# Patient Record
Sex: Male | Born: 1965 | Race: White | Hispanic: No
Health system: Southern US, Community
[De-identification: ages and names within clinical notes are randomized; demographics above are authoritative.]

## PROBLEM LIST (undated history)

## (undated) DIAGNOSIS — J9621 Acute and chronic respiratory failure with hypoxia: Secondary | ICD-10-CM

## (undated) DIAGNOSIS — G9341 Metabolic encephalopathy: Secondary | ICD-10-CM

## (undated) DIAGNOSIS — I482 Chronic atrial fibrillation, unspecified: Secondary | ICD-10-CM

## (undated) DIAGNOSIS — J449 Chronic obstructive pulmonary disease, unspecified: Secondary | ICD-10-CM

## (undated) DIAGNOSIS — J189 Pneumonia, unspecified organism: Secondary | ICD-10-CM

---

## 2020-10-15 ENCOUNTER — Other Ambulatory Visit (HOSPITAL_COMMUNITY): Payer: Medicare Other

## 2020-10-15 ENCOUNTER — Inpatient Hospital Stay
Admission: RE | Admit: 2020-10-15 | Discharge: 2020-11-11 | Disposition: A | Discharge status: Skilled Nursing Facility | Payer: Medicare Other | Attending: Internal Medicine | Admitting: Internal Medicine

## 2020-10-15 DIAGNOSIS — K567 Ileus, unspecified: Secondary | ICD-10-CM

## 2020-10-15 DIAGNOSIS — K819 Cholecystitis, unspecified: Secondary | ICD-10-CM

## 2020-10-15 DIAGNOSIS — D72829 Elevated white blood cell count, unspecified: Secondary | ICD-10-CM

## 2020-10-15 DIAGNOSIS — J449 Chronic obstructive pulmonary disease, unspecified: Secondary | ICD-10-CM | POA: Diagnosis present

## 2020-10-15 DIAGNOSIS — Z931 Gastrostomy status: Secondary | ICD-10-CM

## 2020-10-15 DIAGNOSIS — Z4659 Encounter for fitting and adjustment of other gastrointestinal appliance and device: Secondary | ICD-10-CM

## 2020-10-15 DIAGNOSIS — J189 Pneumonia, unspecified organism: Secondary | ICD-10-CM

## 2020-10-15 DIAGNOSIS — R140 Abdominal distension (gaseous): Secondary | ICD-10-CM

## 2020-10-15 DIAGNOSIS — R109 Unspecified abdominal pain: Secondary | ICD-10-CM

## 2020-10-15 DIAGNOSIS — I482 Chronic atrial fibrillation, unspecified: Secondary | ICD-10-CM | POA: Diagnosis present

## 2020-10-15 DIAGNOSIS — J9621 Acute and chronic respiratory failure with hypoxia: Secondary | ICD-10-CM | POA: Diagnosis present

## 2020-10-15 DIAGNOSIS — G9341 Metabolic encephalopathy: Secondary | ICD-10-CM | POA: Diagnosis present

## 2020-10-15 DIAGNOSIS — J690 Pneumonitis due to inhalation of food and vomit: Secondary | ICD-10-CM

## 2020-10-15 DIAGNOSIS — Z978 Presence of other specified devices: Secondary | ICD-10-CM

## 2020-10-15 DIAGNOSIS — E8770 Fluid overload, unspecified: Secondary | ICD-10-CM

## 2020-10-15 HISTORY — DX: Chronic atrial fibrillation, unspecified: I48.20

## 2020-10-15 HISTORY — DX: Metabolic encephalopathy: G93.41

## 2020-10-15 HISTORY — DX: Pneumonia, unspecified organism: J18.9

## 2020-10-15 HISTORY — DX: Acute and chronic respiratory failure with hypoxia: J96.21

## 2020-10-15 HISTORY — DX: Chronic obstructive pulmonary disease, unspecified: J44.9

## 2020-10-15 LAB — COMPREHENSIVE METABOLIC PANEL
ALT: 16 U/L (ref 0–44)
AST: 18 U/L (ref 15–41)
Albumin: 3.1 g/dL — ABNORMAL LOW (ref 3.5–5.0)
Alkaline Phosphatase: 93 U/L (ref 38–126)
Anion gap: 14 (ref 5–15)
BUN: 11 mg/dL (ref 6–20)
CO2: 27 mmol/L (ref 22–32)
Calcium: 9.5 mg/dL (ref 8.9–10.3)
Chloride: 97 mmol/L — ABNORMAL LOW (ref 98–111)
Creatinine, Ser: 0.59 mg/dL — ABNORMAL LOW (ref 0.61–1.24)
GFR, Estimated: >60 mL/min (ref >60)
Glucose, Bld: 79 mg/dL (ref 70–99)
Potassium: 3.9 mmol/L (ref 3.5–5.1)
Sodium: 138 mmol/L (ref 135–145)
Total Bilirubin: 2.4 mg/dL — ABNORMAL HIGH (ref 0.3–1.2)
Total Protein: 7.5 g/dL (ref 6.5–8.1)

## 2020-10-15 LAB — CBC
HCT: 38 % — ABNORMAL LOW (ref 39.0–52.0)
Hemoglobin: 11.2 g/dL — ABNORMAL LOW (ref 13.0–17.0)
MCH: 25.8 pg — ABNORMAL LOW (ref 26.0–34.0)
MCHC: 29.5 g/dL — ABNORMAL LOW (ref 30.0–36.0)
MCV: 87.6 fL (ref 80.0–100.0)
Platelets: 325 10*3/uL (ref 150–400)
RBC: 4.34 MIL/uL (ref 4.22–5.81)
RDW: 22.4 % — ABNORMAL HIGH (ref 11.5–15.5)
WBC: 12.7 10*3/uL — ABNORMAL HIGH (ref 4.0–10.5)
nRBC: 0 % (ref 0.0–0.2)

## 2020-10-15 MED ORDER — IOHEXOL 300 MG/ML  SOLN
50.0000 mL | Freq: Once | INTRAMUSCULAR | Status: AC | PRN
  Administered 2020-10-15: 50 mL

## 2020-10-16 DIAGNOSIS — J449 Chronic obstructive pulmonary disease, unspecified: Secondary | ICD-10-CM

## 2020-10-16 DIAGNOSIS — J9621 Acute and chronic respiratory failure with hypoxia: Secondary | ICD-10-CM

## 2020-10-16 DIAGNOSIS — J189 Pneumonia, unspecified organism: Secondary | ICD-10-CM | POA: Diagnosis not present

## 2020-10-16 DIAGNOSIS — I482 Chronic atrial fibrillation, unspecified: Secondary | ICD-10-CM

## 2020-10-16 DIAGNOSIS — G9341 Metabolic encephalopathy: Secondary | ICD-10-CM

## 2020-10-16 LAB — VANCOMYCIN, TROUGH: Vancomycin Tr: 12 ug/mL — ABNORMAL LOW (ref 15–20)

## 2020-10-16 NOTE — Consult Note (Signed)
Pulmonary Critical Care Medicine [facility] [place]  PULMONARY SERVICE  Date of Service: 10/16/2020  PULMONARY CRITICAL CARE CONSULT   Maurice Brennan  LTR:[id number]  DOB: 12-04-1965   DOA: 10/15/2020  Referring Physician: Carron Curie, MD  HPI: Maurice Brennan is a 55 y.o. male seen for follow up of Acute on Chronic Respiratory Failure.  Patient has multiple medical problems including chronic alcohol use anxiety BPH COPD depression GERD hyperlipidemia hypertension presents to the hospital basically for fluid overload.  Patient was found at that time to be in atrial fibrillation pulmonary edema.  The patient was started on Cardizem drip for rate controlled continue to have issues as far as pain is concerned so was treated as such.  Hospital course continued with atrial fibrillation which became rate controlled.  Cardiology followed the patient.  On January 4 patient had a drop in blood pressure heart rate was 82 saturations were good.  The patient had some decline in respiratory status with having to go back on BiPAP.  Ultimately ended up severely hypoxic and ended up intubated on the mechanical ventilator.  After patient had been intubated self extubated on the January 9 and was left off the ventilator on BiPAP however decompensated had to be placed back on the ventilator.  Subsequently underwent tracheostomy for anticipated prolonged mechanical ventilation transferred to our facility for further management.  Review of Systems:  ROS performed and is unremarkable other than noted above.  Past Medical History:  Diagnosis Date  . Alcohol abuse  . Anemia  . Anxiety  . BPH (benign prostatic hyperplasia)  . COPD (chronic obstructive pulmonary disease) (CMS-HCC)  . Depression  . GERD (gastroesophageal reflux disease)  . Hyperlipidemia  . Hypertension  . Lumbosacral disc disease  . Osteoarthritis  . Stroke (cerebrum) (CMS-HCC)   Past Surgical History  Past Surgical History:  Procedure  Laterality Date  . APPENDECTOMY  . BACK SURGERY Bilateral  . COLONOSCOPY  . ESOPHAGOGASTRODUODENOSCOPY   Family History  The patient's family history includes Cancer in his mother..  Social History:  Social History   Socioeconomic History  . Marital status: Divorced  Spouse name: Not on file  . Number of children: Not on file  . Years of education: Not on file  . Highest education level: Not on file  Occupational History  . Not on file  Tobacco Use  . Smoking status: Current Every Day Smoker  Packs/day: 1.00  Years: 40.00  Pack years: 40.00  . Smokeless tobacco: Never Used  Substance and Sexual Activity  . Alcohol use: Yes  Alcohol/week: 24.0 standard drinks  Types: 24 Cans of beer per week  Comment: States he quit the 16th of last month  . Drug use: Not Currently  . Sexual activity: Not on file  Other Topics Concern  . Not on file  Social History Narrative  . Not on file     Physical Exam:  Vitals: Temperature is 98.9 pulse 128 respiratory rate 21 blood pressure is 117/67 saturations 97%  Ventilator Settings off the ventilator on T collar FiO2 40%  . General: Comfortable at this time . Eyes: Grossly normal lids, irises & conjunctiva . ENT: grossly tongue is normal . Neck: no obvious mass . Cardiovascular: S1-S2 normal no gallop or rub . Respiratory: Scattered rhonchi noted . Abdomen: Soft and nontender . Skin: no rash seen on limited exam . Musculoskeletal: not rigid . Psychiatric:unable to assess . Neurologic: no seizure no involuntary movements  Labs on Admission:  Basic Metabolic Panel: Recent Labs  Lab 10/15/20 2219  NA 138  K 3.9  CL 97*  CO2 27  GLUCOSE 79  BUN 11  CREATININE 0.59*  CALCIUM 9.5    No results for input(s): PHART, PCO2ART, PO2ART, HCO3, O2SAT in the last 168 hours.  Liver Function Tests: Recent Labs  Lab 10/15/20 2219  AST 18  ALT 16  ALKPHOS 93  BILITOT 2.4*  PROT 7.5  ALBUMIN 3.1*   No results for  input(s): LIPASE, AMYLASE in the last 168 hours. No results for input(s): AMMONIA in the last 168 hours.  CBC: Recent Labs  Lab 10/15/20 2219  WBC 12.7*  HGB 11.2*  HCT 38.0*  MCV 87.6  PLT 325    Cardiac Enzymes: No results for input(s): CKTOTAL, CKMB, CKMBINDEX, TROPONINI in the last 168 hours.  BNP (last 3 results) No results for input(s): BNP in the last 8760 hours.  ProBNP (last 3 results) No results for input(s): PROBNP in the last 8760 hours.   Radiological Exams on Admission: DG ABDOMEN PEG TUBE LOCATION  Result Date: 10/15/2020 CLINICAL DATA:  Gastrostomy tube EXAM: ABDOMEN - 1 VIEW COMPARISON:  None. FINDINGS: The bowel gas pattern is normal. Gastrostomy rube projects over the body of the stomach. Contrast opacified the stomach and duodenal bulb. No gross extravasation. Round calcification in the right mid abdomen may represent gallstone or kidney stone. IMPRESSION: Gastrostomy tube appears to be within the stomach. No gross extravasation. Nonobstructive bowel gas pattern. Electronically Signed   By: Jasmine Pang M.D.   On: 10/15/2020 21:30   DG CHEST PORT 1 VIEW  Result Date: 10/15/2020 CLINICAL DATA:  Trach placement EXAM: PORTABLE CHEST 1 VIEW COMPARISON:  None. FINDINGS: Tracheostomy tube with tip 4.1 cm above carina. Right IJ CVL tip over low right atrium.Cardiomegaly. Airspace disease at the left base. IMPRESSION: 1. Tracheostomy tube with tip 4.1 cm above the carina. 2. Right UJ central venous catheter tip over low right atrium, no pneumothorax 3. Cardiomegaly. 4. Patchy airspace disease at the left base, atelectasis vs pneumonia Electronically Signed   By: Jasmine Pang M.D.   On: 10/15/2020 21:33    Assessment/Plan Active Problems:   Acute on chronic respiratory failure with hypoxia (HCC)   COPD, severe (HCC)   Chronic atrial fibrillation (HCC)   Bilateral pneumonia   Metabolic encephalopathy   1. Acute on chronic respiratory failure with hypoxia plan  continue with T collar now patient is somewhat tachycardic this will be worked up.  Will titrate oxygen accordingly 2. Chronic atrial fibrillation rate now rate controlled patient is going to be continue to be monitored on telemetry.  Cardiology evaluation would be helpful. 3. Bilateral pneumonia and sepsis patient was treated with antibiotics had complicated course because of the pneumonitis. 4. Metabolic encephalopathy multifactorial with sepsis shock as well is history of alcohol abuse. 5. Severe COPD we will continue with medical management nebulizers as deemed necessary.  I have personally seen and evaluated the patient, evaluated laboratory and imaging results, formulated the assessment and plan and placed orders. The Patient requires high complexity decision making with multiple systems involvement.  Case was discussed on Rounds with the Respiratory Therapy Director and the Respiratory staff Time Spent  [name], MD [facility] Pulmonary Critical Care Medicine Sleep Medicine

## 2020-10-17 DIAGNOSIS — J9621 Acute and chronic respiratory failure with hypoxia: Secondary | ICD-10-CM | POA: Diagnosis not present

## 2020-10-17 DIAGNOSIS — J449 Chronic obstructive pulmonary disease, unspecified: Secondary | ICD-10-CM | POA: Diagnosis not present

## 2020-10-17 DIAGNOSIS — J189 Pneumonia, unspecified organism: Secondary | ICD-10-CM | POA: Diagnosis not present

## 2020-10-17 DIAGNOSIS — I482 Chronic atrial fibrillation, unspecified: Secondary | ICD-10-CM | POA: Diagnosis not present

## 2020-10-17 LAB — VANCOMYCIN, TROUGH
Vancomycin Tr: 17 ug/mL (ref 15–20)
Vancomycin Tr: 22 ug/mL (ref 15–20)

## 2020-10-17 NOTE — Progress Notes (Signed)
Pulmonary Critical Care Medicine [facility] [place]   PULMONARY CRITICAL CARE SERVICE  PROGRESS NOTE  Date of Service: 10/17/2020  Maurice Brennan  QMV:[id number]  DOB: 06-19-1966   DOA: 10/15/2020  Referring Physician: Carron Curie, MD  HPI: Maurice Brennan is a 55 y.o. male seen for follow up of Acute on Chronic Respiratory Failure.  At this time patient is on T collar has been on 40% FiO2 with good saturation  Medications: Reviewed on Rounds  Physical Exam:  Vitals: Temperature is 97.8 pulse 102 respiratory 40 blood pressure is 136/72 saturations 99%  Ventilator Settings off the vent on T collar FiO2 40%  . General: Comfortable at this time . Eyes: Grossly normal lids, irises & conjunctiva . ENT: grossly tongue is normal . Neck: no obvious mass . Cardiovascular: S1 S2 normal no gallop . Respiratory: No rhonchi no rales . Abdomen: soft . Skin: no rash seen on limited exam . Musculoskeletal: not rigid . Psychiatric:unable to assess . Neurologic: no seizure no involuntary movements         Lab Data:   Basic Metabolic Panel: Recent Labs  Lab 10/15/20 2219  NA 138  K 3.9  CL 97*  CO2 27  GLUCOSE 79  BUN 11  CREATININE 0.59*  CALCIUM 9.5    ABG: No results for input(s): PHART, PCO2ART, PO2ART, HCO3, O2SAT in the last 168 hours.  Liver Function Tests: Recent Labs  Lab 10/15/20 2219  AST 18  ALT 16  ALKPHOS 93  BILITOT 2.4*  PROT 7.5  ALBUMIN 3.1*   No results for input(s): LIPASE, AMYLASE in the last 168 hours. No results for input(s): AMMONIA in the last 168 hours.  CBC: Recent Labs  Lab 10/15/20 2219  WBC 12.7*  HGB 11.2*  HCT 38.0*  MCV 87.6  PLT 325    Cardiac Enzymes: No results for input(s): CKTOTAL, CKMB, CKMBINDEX, TROPONINI in the last 168 hours.  BNP (last 3 results) No results for input(s): BNP in the last 8760 hours.  ProBNP (last 3 results) No results for input(s): PROBNP in the last 8760 hours.  Radiological  Exams: DG ABDOMEN PEG TUBE LOCATION  Result Date: 10/15/2020 CLINICAL DATA:  Gastrostomy tube EXAM: ABDOMEN - 1 VIEW COMPARISON:  None. FINDINGS: The bowel gas pattern is normal. Gastrostomy rube projects over the body of the stomach. Contrast opacified the stomach and duodenal bulb. No gross extravasation. Round calcification in the right mid abdomen may represent gallstone or kidney stone. IMPRESSION: Gastrostomy tube appears to be within the stomach. No gross extravasation. Nonobstructive bowel gas pattern. Electronically Signed   By: Jasmine Pang M.D.   On: 10/15/2020 21:30   DG CHEST PORT 1 VIEW  Result Date: 10/15/2020 CLINICAL DATA:  Trach placement EXAM: PORTABLE CHEST 1 VIEW COMPARISON:  None. FINDINGS: Tracheostomy tube with tip 4.1 cm above carina. Right IJ CVL tip over low right atrium.Cardiomegaly. Airspace disease at the left base. IMPRESSION: 1. Tracheostomy tube with tip 4.1 cm above the carina. 2. Right UJ central venous catheter tip over low right atrium, no pneumothorax 3. Cardiomegaly. 4. Patchy airspace disease at the left base, atelectasis vs pneumonia Electronically Signed   By: Jasmine Pang M.D.   On: 10/15/2020 21:33    Assessment/Plan Active Problems:   Acute on chronic respiratory failure with hypoxia (HCC)   COPD, severe (HCC)   Chronic atrial fibrillation (HCC)   Bilateral pneumonia   Metabolic encephalopathy   1. Acute on chronic respiratory failure hypoxia we will continue  with the T-piece the sutures will be removed titrate oxygen down today 2. Severe COPD medical management 3. Chronic atrial fibrillation rate is controlled 4. Bilateral pneumonia treated with antibiotics. 5. Metabolic encephalopathy patient is at baseline   I have personally seen and evaluated the patient, evaluated laboratory and imaging results, formulated the assessment and plan and placed orders. The Patient requires high complexity decision making with multiple systems involvement.   Rounds were done with the Respiratory Therapy Director and Staff therapists and discussed with nursing staff also.  [name], MD [facility] Pulmonary Critical Care Medicine Sleep Medicine

## 2020-10-18 DIAGNOSIS — J449 Chronic obstructive pulmonary disease, unspecified: Secondary | ICD-10-CM | POA: Diagnosis not present

## 2020-10-18 DIAGNOSIS — J189 Pneumonia, unspecified organism: Secondary | ICD-10-CM | POA: Diagnosis not present

## 2020-10-18 DIAGNOSIS — I482 Chronic atrial fibrillation, unspecified: Secondary | ICD-10-CM | POA: Diagnosis not present

## 2020-10-18 DIAGNOSIS — J9621 Acute and chronic respiratory failure with hypoxia: Secondary | ICD-10-CM | POA: Diagnosis not present

## 2020-10-18 LAB — VANCOMYCIN, TROUGH: Vancomycin Tr: 11 ug/mL — ABNORMAL LOW (ref 15–20)

## 2020-10-18 NOTE — Consult Note (Signed)
Referring Physician: S. Manson Passey, MD   Maurice Brennan is an 55 y.o. male.                       Chief Complaint: Atrial fibrillation/Aspiration pneumonia and liver cirrhosis  HPI: 55 years old white male with PMH of liver cirrhosis with ascites, atrial fibrillation, tobacco use disorder, Mild MR, TR and moderate AI has malnutrition and aspiration pneumonia. He has tracheostomy and PEG placed on 09/30/2020. He is on IV antibiotics. He is on Meropenem, amiodarone and spironolactone. His echocardiogram last month showed good LV systolic function with mild MR, TR and moderate AI.   Past medical history: No type 2 DM, No HTN, Positive cigarette smoking, 40 pack year. Positive alcohol abuse. Positive liver cirhrosis with ascites. Positive COPD  PSH: Tracheostomy, PEG placement and Bronchoscopy  The histories are not reviewed yet. Please review them in the "History" navigator section and refresh this SmartLink.  No family history on file. Social History:  has Positive history on file for tobacco use, alcohol use. No drug use.  Allergies: Not on File  No medications prior to admission.  See list on chart  Results for orders placed or performed during the hospital encounter of 10/15/20 (from the past 48 hour(s))  Vancomycin, trough     Status: None   Collection Time: 10/16/20 11:35 PM  Result Value Ref Range   Vancomycin Tr 17 15 - 20 ug/mL    Comment: Performed at [facility], [address], [place], [place] [address]  Vancomycin, trough     Status: Abnormal   Collection Time: 10/17/20 11:12 PM  Result Value Ref Range   Vancomycin Tr 22 (HH) 15 - 20 ug/mL    Comment: CRITICAL RESULT CALLED TO, READ BACK BY AND VERIFIED WITH: [facility] M,RN 10/17/20 2348 WAYK Performed at [facility], [address] [place], [place] [address]   Vancomycin, trough     Status: Abnormal   Collection Time: 10/18/20 11:31 AM  Result Value Ref Range   Vancomycin Tr 11 (L) 15 - 20 ug/mL    Comment:  Performed at [facility], [address] [place], [place] [address]   No results found.  Review Of Systems As per HPI.  P: 93, R: 32, BP: 109/50, O2 sat 97 % There were no vitals taken for this visit. There is no height or weight on file to calculate BMI. General appearance: awake, cooperative, appears stated age and no distress Head: Normocephalic, atraumatic. Eyes: Blue eyes, pink conjunctiva, corneas : Mildly icteric.  Neck: No adenopathy, no carotid bruit, no JVD, supple, symmetrical, trachea midline and thyroid not enlarged. Resp: Clearing to auscultation bilaterally. Cardio: Irregular rate and rhythm, S1, S2 normal, II/VI systolic murmur, no click, rub or gallop GI: Soft, non-tender; bowel sounds normal; no organomegaly. Extremities: No edema, cyanosis or clubbing. Skin: Warm and dry.  Neurologic: Alert and oriented X 1, normal strength.   Assessment/Plan Acute on chronic respiratory failure with hypoxia Acute liver failure Liver cirrhosis with ascites S/P PEG placement S/P Tracheostomy placement Aspiration pneumonia Chronic atrial fibrillation Alcohol use disorder Tobacco use disorder Protein calorie malnutrition, moderate  Plan: Continue Amiodarone Discontinue Digoxin as LV systolic function is normal. Add small dose metoprolol for heart rate control.  Time spent: Review of old records, Lab, x-rays, EKG, other cardiac tests, examination, discussion with patient/Nurse/Doctor over 70 minutes.  Ricki Rodriguez, MD  10/18/2020, 7:04 PM

## 2020-10-18 NOTE — Progress Notes (Signed)
Pulmonary Critical Care Medicine [facility] [place]   PULMONARY CRITICAL CARE SERVICE  PROGRESS NOTE  Date of Service: 10/18/2020  Maurice Brennan  OEU:[id number]  DOB: 09-01-1965   DOA: 10/15/2020  Referring Physician: Carron Curie, MD  HPI: Maurice Brennan is a 55 y.o. male seen for follow up of Acute on Chronic Respiratory Failure.  Patient is on T collar currently on 30% FiO2 trach is ready for changing  Medications: Reviewed on Rounds  Physical Exam:  Vitals: Temperature is 96.4 pulse 94 respiratory 32 blood pressure is 112/68 saturations 98%  Ventilator Settings on T collar FiO2 of 30%  . General: Comfortable at this time . Eyes: Grossly normal lids, irises & conjunctiva . ENT: grossly tongue is normal . Neck: no obvious mass . Cardiovascular: S1 S2 normal no gallop . Respiratory: Coarse breath sounds with few scattered rhonchi . Abdomen: soft . Skin: no rash seen on limited exam . Musculoskeletal: not rigid . Psychiatric:unable to assess . Neurologic: no seizure no involuntary movements         Lab Data:   Basic Metabolic Panel: Recent Labs  Lab 10/15/20 2219  NA 138  K 3.9  CL 97*  CO2 27  GLUCOSE 79  BUN 11  CREATININE 0.59*  CALCIUM 9.5    ABG: No results for input(s): PHART, PCO2ART, PO2ART, HCO3, O2SAT in the last 168 hours.  Liver Function Tests: Recent Labs  Lab 10/15/20 2219  AST 18  ALT 16  ALKPHOS 93  BILITOT 2.4*  PROT 7.5  ALBUMIN 3.1*   No results for input(s): LIPASE, AMYLASE in the last 168 hours. No results for input(s): AMMONIA in the last 168 hours.  CBC: Recent Labs  Lab 10/15/20 2219  WBC 12.7*  HGB 11.2*  HCT 38.0*  MCV 87.6  PLT 325    Cardiac Enzymes: No results for input(s): CKTOTAL, CKMB, CKMBINDEX, TROPONINI in the last 168 hours.  BNP (last 3 results) No results for input(s): BNP in the last 8760 hours.  ProBNP (last 3 results) No results for input(s): PROBNP in the last 8760  hours.  Radiological Exams: No results found.  Assessment/Plan Active Problems:   Acute on chronic respiratory failure with hypoxia (HCC)   COPD, severe (HCC)   Chronic atrial fibrillation (HCC)   Bilateral pneumonia   Metabolic encephalopathy   1. Acute on chronic respiratory failure hypoxia we will continue with T collar trials COVID-19 his trach out today 2. Chronic atrial fibrillation rate is controlled 3. Severe COPD medical management 4. Bilateral pneumonia treated with antibiotics 5. Metabolic encephalopathy appears to be at baseline   I have personally seen and evaluated the patient, evaluated laboratory and imaging results, formulated the assessment and plan and placed orders. The Patient requires high complexity decision making with multiple systems involvement.  Rounds were done with the Respiratory Therapy Director and Staff therapists and discussed with nursing staff also.  [name], MD [facility] Pulmonary Critical Care Medicine Sleep Medicine

## 2020-10-19 ENCOUNTER — Encounter: Payer: Self-pay | Admitting: Internal Medicine

## 2020-10-19 DIAGNOSIS — I482 Chronic atrial fibrillation, unspecified: Secondary | ICD-10-CM | POA: Diagnosis not present

## 2020-10-19 DIAGNOSIS — J9621 Acute and chronic respiratory failure with hypoxia: Secondary | ICD-10-CM | POA: Diagnosis present

## 2020-10-19 DIAGNOSIS — J449 Chronic obstructive pulmonary disease, unspecified: Secondary | ICD-10-CM | POA: Diagnosis present

## 2020-10-19 DIAGNOSIS — G9341 Metabolic encephalopathy: Secondary | ICD-10-CM | POA: Diagnosis present

## 2020-10-19 DIAGNOSIS — J189 Pneumonia, unspecified organism: Secondary | ICD-10-CM | POA: Diagnosis not present

## 2020-10-19 LAB — CULTURE, RESPIRATORY W GRAM STAIN

## 2020-10-19 NOTE — Consult Note (Signed)
Ref: Patient, No Pcp Per   Subjective:  No new events.  Objective:  Vital Signs in the last 24 hours: BP: 103/59 P: 85, R: 27 O2 sat 94 %.  FiO2 28 %, T collar.  Physical Exam: BP Readings from Last 1 Encounters:  No data found for BP     Wt Readings from Last 1 Encounters:  No data found for Wt    Weight change:  There is no height or weight on file to calculate BMI. HEENT: [place]/AT, Eyes-Blue, Conjunctiva-Pink, Sclera-Non-icteric Neck: No JVD, No bruit, Trachea midline. Lungs:  Clearing, Bilateral. Cardiac:  Irregular rhythm, normal S1 and S2, no S3. II/VI systolic murmur. Abdomen:  Soft, non-tender. BS present. Extremities:  No edema present. No cyanosis. No clubbing. CNS: AxOx1, Cranial nerves grossly intact, moves all 4 extremities.  Skin: Warm and dry.   Intake/Output from previous day: No intake/output data recorded.    Lab Results: BMET    Component Value Date/Time   NA 138 10/15/2020 2219   K 3.9 10/15/2020 2219   CL 97 (L) 10/15/2020 2219   CO2 27 10/15/2020 2219   GLUCOSE 79 10/15/2020 2219   BUN 11 10/15/2020 2219   CREATININE 0.59 (L) 10/15/2020 2219   CALCIUM 9.5 10/15/2020 2219   GFRNONAA >60 10/15/2020 2219   CBC    Component Value Date/Time   WBC 12.7 (H) 10/15/2020 2219   RBC 4.34 10/15/2020 2219   HGB 11.2 (L) 10/15/2020 2219   HCT 38.0 (L) 10/15/2020 2219   PLT 325 10/15/2020 2219   MCV 87.6 10/15/2020 2219   MCH 25.8 (L) 10/15/2020 2219   MCHC 29.5 (L) 10/15/2020 2219   RDW 22.4 (H) 10/15/2020 2219   HEPATIC Function Panel Recent Labs    10/15/20 2219  PROT 7.5   HEMOGLOBIN A1C No components found for: HGA1C,  MPG CARDIAC ENZYMES No results found for: CKTOTAL, CKMB, CKMBINDEX, TROPONINI BNP No results for input(s): PROBNP in the last 8760 hours. TSH No results for input(s): TSH in the last 8760 hours. CHOLESTEROL No results for input(s): CHOL in the last 8760 hours.  Scheduled Meds: Continuous Infusions: PRN  Meds:.  Assessment/Plan: Acute on chronic respiratory failure with hypoxia Acute liver failure Liver cirrhosis with ascites Chronic atrial fibrillation Alcohol use disorder Tobacco use disorder S/P PEG S/P Tracheostomy Aspiration pneumonia Protein calorie malnutrition  Plan: Continue medical treatment.    LOS: 0 days   Time spent including chart review, lab review, examination, discussion with patient/Nurse/Doctor : 30 min   Orpah Cobb  MD  10/19/2020, 2:39 PM

## 2020-10-19 NOTE — Progress Notes (Signed)
Pulmonary Critical Care Medicine [facility] [place]   PULMONARY CRITICAL CARE SERVICE  PROGRESS NOTE  Date of Service: 10/19/2020  Maurice Brennan  UJW:[id number]  DOB: [birth year]   DOA: 10/15/2020  Referring Physician: Carron Curie, MD  HPI: Maurice Brennan is a 55 y.o. male seen for follow up of Acute on Chronic Respiratory Failure.  Patient currently is on T collar has been on 28% FiO2 with good saturations secretions are fairly moderate  Medications: Reviewed on Rounds  Physical Exam:  Vitals: Temperature is 97.7 pulse 89 respiratory rate 21 blood pressure is 130/79 saturations 97%  Ventilator Settings on T collar with an FiO2 of 28%  . General: Comfortable at this time . Eyes: Grossly normal lids, irises & conjunctiva . ENT: grossly tongue is normal . Neck: no obvious mass . Cardiovascular: S1 S2 normal no gallop . Respiratory: Scattered rhonchi expansion is equal . Abdomen: soft . Skin: no rash seen on limited exam . Musculoskeletal: not rigid . Psychiatric:unable to assess . Neurologic: no seizure no involuntary movements         Lab Data:   Basic Metabolic Panel: Recent Labs  Lab 10/15/20 2219  NA 138  K 3.9  CL 97*  CO2 27  GLUCOSE 79  BUN 11  CREATININE 0.59*  CALCIUM 9.5    ABG: No results for input(s): PHART, PCO2ART, PO2ART, HCO3, O2SAT in the last 168 hours.  Liver Function Tests: Recent Labs  Lab 10/15/20 2219  AST 18  ALT 16  ALKPHOS 93  BILITOT 2.4*  PROT 7.5  ALBUMIN 3.1*   No results for input(s): LIPASE, AMYLASE in the last 168 hours. No results for input(s): AMMONIA in the last 168 hours.  CBC: Recent Labs  Lab 10/15/20 2219  WBC 12.7*  HGB 11.2*  HCT 38.0*  MCV 87.6  PLT 325    Cardiac Enzymes: No results for input(s): CKTOTAL, CKMB, CKMBINDEX, TROPONINI in the last 168 hours.  BNP (last 3 results) No results for input(s): BNP in the last 8760 hours.  ProBNP (last 3 results) No results for input(s): PROBNP  in the last 8760 hours.  Radiological Exams: No results found.  Assessment/Plan Active Problems:   Acute on chronic respiratory failure with hypoxia (HCC)   COPD, severe (HCC)   Chronic atrial fibrillation (HCC)   Bilateral pneumonia   Metabolic encephalopathy   1. Acute on chronic respiratory failure hypoxia we will continue with T collar trials titrate oxygen continue pulmonary toilet. 2. Severe COPD medical management 3. Chronic atrial fibrillation rate is controlled 4. Bilateral pneumonia treated 5. Metabolic encephalopathy no change   I have personally seen and evaluated the patient, evaluated laboratory and imaging results, formulated the assessment and plan and placed orders. The Patient requires high complexity decision making with multiple systems involvement.  Rounds were done with the Respiratory Therapy Director and Staff therapists and discussed with nursing staff also.  [name], MD [facility] Pulmonary Critical Care Medicine Sleep Medicine

## 2020-10-20 DIAGNOSIS — I482 Chronic atrial fibrillation, unspecified: Secondary | ICD-10-CM | POA: Diagnosis not present

## 2020-10-20 DIAGNOSIS — J449 Chronic obstructive pulmonary disease, unspecified: Secondary | ICD-10-CM | POA: Diagnosis not present

## 2020-10-20 DIAGNOSIS — J189 Pneumonia, unspecified organism: Secondary | ICD-10-CM | POA: Diagnosis not present

## 2020-10-20 DIAGNOSIS — J9621 Acute and chronic respiratory failure with hypoxia: Secondary | ICD-10-CM | POA: Diagnosis not present

## 2020-10-20 NOTE — Progress Notes (Signed)
Pulmonary Critical Care Medicine [facility] [place]   PULMONARY CRITICAL CARE SERVICE  PROGRESS NOTE  Date of Service: 10/20/2020  Maurice Brennan  JOA:[id number]  DOB: [birth year]   DOA: 10/15/2020  Referring Physician: Carron Curie, MD  HPI: Maurice Brennan is a 55 y.o. male seen for follow up of Acute on Chronic Respiratory Failure.  Patient is on T collar currently on 28% FiO2  Medications: Reviewed on Rounds  Physical Exam:  Vitals: Temperature is 97.6 pulse 89 respiratory rate 24 blood pressure is 151/72 saturations 98%  Ventilator Settings off the ventilator on T collar  . General: Comfortable at this time . Eyes: Grossly normal lids, irises & conjunctiva . ENT: grossly tongue is normal . Neck: no obvious mass . Cardiovascular: S1 S2 normal no gallop . Respiratory: Scattered rhonchi expansion is equal . Abdomen: soft . Skin: no rash seen on limited exam . Musculoskeletal: not rigid . Psychiatric:unable to assess . Neurologic: no seizure no involuntary movements         Lab Data:   Basic Metabolic Panel: Recent Labs  Lab 10/15/20 2219  NA 138  K 3.9  CL 97*  CO2 27  GLUCOSE 79  BUN 11  CREATININE 0.59*  CALCIUM 9.5    ABG: No results for input(s): PHART, PCO2ART, PO2ART, HCO3, O2SAT in the last 168 hours.  Liver Function Tests: Recent Labs  Lab 10/15/20 2219  AST 18  ALT 16  ALKPHOS 93  BILITOT 2.4*  PROT 7.5  ALBUMIN 3.1*   No results for input(s): LIPASE, AMYLASE in the last 168 hours. No results for input(s): AMMONIA in the last 168 hours.  CBC: Recent Labs  Lab 10/15/20 2219  WBC 12.7*  HGB 11.2*  HCT 38.0*  MCV 87.6  PLT 325    Cardiac Enzymes: No results for input(s): CKTOTAL, CKMB, CKMBINDEX, TROPONINI in the last 168 hours.  BNP (last 3 results) No results for input(s): BNP in the last 8760 hours.  ProBNP (last 3 results) No results for input(s): PROBNP in the last 8760 hours.  Radiological Exams: No results  found.  Assessment/Plan Active Problems:   Acute on chronic respiratory failure with hypoxia (HCC)   COPD, severe (HCC)   Chronic atrial fibrillation (HCC)   Bilateral pneumonia   Metabolic encephalopathy   1. Acute on chronic respiratory failure hypoxia we will continue with T-piece titrate oxygen continue secretion management supportive care 2. Severe COPD medical management 3. Chronic atrial fibrillation 4. Bilateral pneumonia treated 5. Metabolic encephalopathy no change   I have personally seen and evaluated the patient, evaluated laboratory and imaging results, formulated the assessment and plan and placed orders. The Patient requires high complexity decision making with multiple systems involvement.  Rounds were done with the Respiratory Therapy Director and Staff therapists and discussed with nursing staff also.  [name], MD [facility] Pulmonary Critical Care Medicine Sleep Medicine

## 2020-10-21 ENCOUNTER — Other Ambulatory Visit (HOSPITAL_COMMUNITY): Payer: Medicare Other

## 2020-10-21 DIAGNOSIS — J189 Pneumonia, unspecified organism: Secondary | ICD-10-CM | POA: Diagnosis not present

## 2020-10-21 DIAGNOSIS — J449 Chronic obstructive pulmonary disease, unspecified: Secondary | ICD-10-CM | POA: Diagnosis not present

## 2020-10-21 DIAGNOSIS — I482 Chronic atrial fibrillation, unspecified: Secondary | ICD-10-CM | POA: Diagnosis not present

## 2020-10-21 DIAGNOSIS — J9621 Acute and chronic respiratory failure with hypoxia: Secondary | ICD-10-CM | POA: Diagnosis not present

## 2020-10-21 LAB — CBC
HCT: 35.6 % — ABNORMAL LOW (ref 39.0–52.0)
Hemoglobin: 10.8 g/dL — ABNORMAL LOW (ref 13.0–17.0)
MCH: 26 pg (ref 26.0–34.0)
MCHC: 30.3 g/dL (ref 30.0–36.0)
MCV: 85.8 fL (ref 80.0–100.0)
Platelets: 288 10*3/uL (ref 150–400)
RBC: 4.15 MIL/uL — ABNORMAL LOW (ref 4.22–5.81)
RDW: 21.2 % — ABNORMAL HIGH (ref 11.5–15.5)
WBC: 10.2 10*3/uL (ref 4.0–10.5)
nRBC: 0 % (ref 0.0–0.2)

## 2020-10-21 LAB — COMPREHENSIVE METABOLIC PANEL
ALT: 13 U/L (ref 0–44)
AST: 28 U/L (ref 15–41)
Albumin: 2.8 g/dL — ABNORMAL LOW (ref 3.5–5.0)
Alkaline Phosphatase: 81 U/L (ref 38–126)
Anion gap: 12 (ref 5–15)
BUN: 11 mg/dL (ref 6–20)
CO2: 25 mmol/L (ref 22–32)
Calcium: 9.7 mg/dL (ref 8.9–10.3)
Chloride: 96 mmol/L — ABNORMAL LOW (ref 98–111)
Creatinine, Ser: 0.45 mg/dL — ABNORMAL LOW (ref 0.61–1.24)
GFR, Estimated: >60 mL/min (ref >60)
Glucose, Bld: 107 mg/dL — ABNORMAL HIGH (ref 70–99)
Potassium: 4.6 mmol/L (ref 3.5–5.1)
Sodium: 133 mmol/L — ABNORMAL LOW (ref 135–145)
Total Bilirubin: 1.1 mg/dL (ref 0.3–1.2)
Total Protein: 6.7 g/dL (ref 6.5–8.1)

## 2020-10-21 LAB — VANCOMYCIN, TROUGH: Vancomycin Tr: 25 ug/mL (ref 15–20)

## 2020-10-21 NOTE — Progress Notes (Signed)
Pulmonary Critical Care Medicine [facility] [place]   PULMONARY CRITICAL CARE SERVICE  PROGRESS NOTE  Date of Service: 10/21/2020  Rickey Sadowski  GGY:[id number]  DOB: 11-06-1965   DOA: 10/15/2020  Referring Physician: Carron Curie, MD  HPI: Maurice Brennan is a 55 y.o. male seen for follow up of Acute on Chronic Respiratory Failure.  Patient currently is on T collar on 28% FiO2 good saturations noted  Medications: Reviewed on Rounds  Physical Exam:  Vitals: Temperature 96.4 pulse 90 respiratory 18 blood pressure is 100/65 saturations 99%  Ventilator Settings on T collar FiO2 28%  . General: Comfortable at this time . Eyes: Grossly normal lids, irises & conjunctiva . ENT: grossly tongue is normal . Neck: no obvious mass . Cardiovascular: S1 S2 normal no gallop . Respiratory: No rhonchi very coarse breath sound . Abdomen: soft . Skin: no rash seen on limited exam . Musculoskeletal: not rigid . Psychiatric:unable to assess . Neurologic: no seizure no involuntary movements         Lab Data:   Basic Metabolic Panel: Recent Labs  Lab 10/15/20 2219 10/21/20 0413  NA 138 133*  K 3.9 4.6  CL 97* 96*  CO2 27 25  GLUCOSE 79 107*  BUN 11 11  CREATININE 0.59* 0.45*  CALCIUM 9.5 9.7    ABG: No results for input(s): PHART, PCO2ART, PO2ART, HCO3, O2SAT in the last 168 hours.  Liver Function Tests: Recent Labs  Lab 10/15/20 2219 10/21/20 0413  AST 18 28  ALT 16 13  ALKPHOS 93 81  BILITOT 2.4* 1.1  PROT 7.5 6.7  ALBUMIN 3.1* 2.8*   No results for input(s): LIPASE, AMYLASE in the last 168 hours. No results for input(s): AMMONIA in the last 168 hours.  CBC: Recent Labs  Lab 10/15/20 2219 10/21/20 0413  WBC 12.7* 10.2  HGB 11.2* 10.8*  HCT 38.0* 35.6*  MCV 87.6 85.8  PLT 325 288    Cardiac Enzymes: No results for input(s): CKTOTAL, CKMB, CKMBINDEX, TROPONINI in the last 168 hours.  BNP (last 3 results) No results for input(s): BNP in the last 8760  hours.  ProBNP (last 3 results) No results for input(s): PROBNP in the last 8760 hours.  Radiological Exams: No results found.  Assessment/Plan Active Problems:   Acute on chronic respiratory failure with hypoxia (HCC)   COPD, severe (HCC)   Chronic atrial fibrillation (HCC)   Bilateral pneumonia   Metabolic encephalopathy   1. Acute on chronic respiratory failure hypoxia we will continue T collar titrate oxygen continue pulmonary toilet 2. Severe COPD medical management 3. Atrial fibrillation rate is controlled 4. Bilateral pneumonia treated clinically improving 5. Metabolic encephalopathy we will continue with supportive care   I have personally seen and evaluated the patient, evaluated laboratory and imaging results, formulated the assessment and plan and placed orders. The Patient requires high complexity decision making with multiple systems involvement.  Rounds were done with the Respiratory Therapy Director and Staff therapists and discussed with nursing staff also.  [name], MD [facility] Pulmonary Critical Care Medicine Sleep Medicine

## 2020-10-22 DIAGNOSIS — J9621 Acute and chronic respiratory failure with hypoxia: Secondary | ICD-10-CM | POA: Diagnosis not present

## 2020-10-22 DIAGNOSIS — J449 Chronic obstructive pulmonary disease, unspecified: Secondary | ICD-10-CM | POA: Diagnosis not present

## 2020-10-22 DIAGNOSIS — J189 Pneumonia, unspecified organism: Secondary | ICD-10-CM | POA: Diagnosis not present

## 2020-10-22 DIAGNOSIS — I482 Chronic atrial fibrillation, unspecified: Secondary | ICD-10-CM | POA: Diagnosis not present

## 2020-10-22 LAB — POTASSIUM: Potassium: 4.3 mmol/L (ref 3.5–5.1)

## 2020-10-22 LAB — VANCOMYCIN, TROUGH: Vancomycin Tr: 14 ug/mL — ABNORMAL LOW (ref 15–20)

## 2020-10-22 NOTE — Progress Notes (Signed)
Pulmonary Critical Care Medicine [facility] [place]   PULMONARY CRITICAL CARE SERVICE  PROGRESS NOTE  Date of Service: 10/22/2020  Maurice Brennan  IEP:[id number]  DOB: 11-05-1965   DOA: 10/15/2020  Referring Physician: Carron Curie, MD  HPI: Maurice Brennan is a 55 y.o. male seen for follow up of Acute on Chronic Respiratory Failure.  Patient currently is capping goal of 24 hours  Medications: Reviewed on Rounds  Physical Exam:  Vitals: Temperature 96.5 pulse 85 respiratory 24 blood pressure is 103/55 saturations 97%  Ventilator Settings capping off the vent  . General: Comfortable at this time . Eyes: Grossly normal lids, irises & conjunctiva . ENT: grossly tongue is normal . Neck: no obvious mass . Cardiovascular: S1 S2 normal no gallop . Respiratory: No rhonchi no rales are noted at this time . Abdomen: soft . Skin: no rash seen on limited exam . Musculoskeletal: not rigid . Psychiatric:unable to assess . Neurologic: no seizure no involuntary movements         Lab Data:   Basic Metabolic Panel: Recent Labs  Lab 10/15/20 2219 10/21/20 0413 10/22/20 0424  NA 138 133*  --   K 3.9 4.6 4.3  CL 97* 96*  --   CO2 27 25  --   GLUCOSE 79 107*  --   BUN 11 11  --   CREATININE 0.59* 0.45*  --   CALCIUM 9.5 9.7  --     ABG: No results for input(s): PHART, PCO2ART, PO2ART, HCO3, O2SAT in the last 168 hours.  Liver Function Tests: Recent Labs  Lab 10/15/20 2219 10/21/20 0413  AST 18 28  ALT 16 13  ALKPHOS 93 81  BILITOT 2.4* 1.1  PROT 7.5 6.7  ALBUMIN 3.1* 2.8*   No results for input(s): LIPASE, AMYLASE in the last 168 hours. No results for input(s): AMMONIA in the last 168 hours.  CBC: Recent Labs  Lab 10/15/20 2219 10/21/20 0413  WBC 12.7* 10.2  HGB 11.2* 10.8*  HCT 38.0* 35.6*  MCV 87.6 85.8  PLT 325 288    Cardiac Enzymes: No results for input(s): CKTOTAL, CKMB, CKMBINDEX, TROPONINI in the last 168 hours.  BNP (last 3 results) No  results for input(s): BNP in the last 8760 hours.  ProBNP (last 3 results) No results for input(s): PROBNP in the last 8760 hours.  Radiological Exams: No results found.  Assessment/Plan Active Problems:   Acute on chronic respiratory failure with hypoxia (HCC)   COPD, severe (HCC)   Chronic atrial fibrillation (HCC)   Bilateral pneumonia   Metabolic encephalopathy   1. Acute on chronic respiratory failure hypoxia we will continue with capping trials 2. Severe COPD medical management 3. Chronic atrial fibrillation rate is controlled 4. Bilateral pneumonia treated slowly improving 5. Metabolic encephalopathy no change   I have personally seen and evaluated the patient, evaluated laboratory and imaging results, formulated the assessment and plan and placed orders. The Patient requires high complexity decision making with multiple systems involvement.  Rounds were done with the Respiratory Therapy Director and Staff therapists and discussed with nursing staff also.  [name], MD [facility] Pulmonary Critical Care Medicine Sleep Medicine

## 2020-10-23 DIAGNOSIS — J189 Pneumonia, unspecified organism: Secondary | ICD-10-CM | POA: Diagnosis not present

## 2020-10-23 DIAGNOSIS — J9621 Acute and chronic respiratory failure with hypoxia: Secondary | ICD-10-CM | POA: Diagnosis not present

## 2020-10-23 DIAGNOSIS — J449 Chronic obstructive pulmonary disease, unspecified: Secondary | ICD-10-CM | POA: Diagnosis not present

## 2020-10-23 DIAGNOSIS — I482 Chronic atrial fibrillation, unspecified: Secondary | ICD-10-CM | POA: Diagnosis not present

## 2020-10-23 LAB — AMMONIA: Ammonia: 60 umol/L — ABNORMAL HIGH (ref 9–35)

## 2020-10-23 NOTE — Progress Notes (Signed)
Pulmonary Critical Care Medicine [facility] [place]   PULMONARY CRITICAL CARE SERVICE  PROGRESS NOTE  Date of Service: 10/23/2020  Maurice Brennan  FOY:[id number]  DOB: 01-Jan-1966   DOA: 10/15/2020  Referring Physician: Carron Curie, MD  HPI: Maurice Brennan is a 55 y.o. male seen for follow up of Acute on Chronic Respiratory Failure.  Patient is capping currently on 2 L of oxygen ready for decannulation  Medications: Reviewed on Rounds  Physical Exam:  Vitals: Temperature is 97.7 pulse 80 respiratory rate 20 blood pressure is 122/99 saturations 98%  Ventilator Settings capping on 2 L of oxygen  . General: Comfortable at this time . Eyes: Grossly normal lids, irises & conjunctiva . ENT: grossly tongue is normal . Neck: no obvious mass . Cardiovascular: S1 S2 normal no gallop . Respiratory: Scattered rhonchi expansion is equal at this time . Abdomen: soft . Skin: no rash seen on limited exam . Musculoskeletal: not rigid . Psychiatric:unable to assess . Neurologic: no seizure no involuntary movements         Lab Data:   Basic Metabolic Panel: Recent Labs  Lab 10/21/20 0413 10/22/20 0424  NA 133*  --   K 4.6 4.3  CL 96*  --   CO2 25  --   GLUCOSE 107*  --   BUN 11  --   CREATININE 0.45*  --   CALCIUM 9.7  --     ABG: No results for input(s): PHART, PCO2ART, PO2ART, HCO3, O2SAT in the last 168 hours.  Liver Function Tests: Recent Labs  Lab 10/21/20 0413  AST 28  ALT 13  ALKPHOS 81  BILITOT 1.1  PROT 6.7  ALBUMIN 2.8*   No results for input(s): LIPASE, AMYLASE in the last 168 hours. No results for input(s): AMMONIA in the last 168 hours.  CBC: Recent Labs  Lab 10/21/20 0413  WBC 10.2  HGB 10.8*  HCT 35.6*  MCV 85.8  PLT 288    Cardiac Enzymes: No results for input(s): CKTOTAL, CKMB, CKMBINDEX, TROPONINI in the last 168 hours.  BNP (last 3 results) No results for input(s): BNP in the last 8760 hours.  ProBNP (last 3 results) No  results for input(s): PROBNP in the last 8760 hours.  Radiological Exams: No results found.  Assessment/Plan Active Problems:   Acute on chronic respiratory failure with hypoxia (HCC)   COPD, severe (HCC)   Chronic atrial fibrillation (HCC)   Bilateral pneumonia   Metabolic encephalopathy   1. Acute on chronic respiratory failure hypoxia we will proceed to decannulate 2. Severe COPD medical management 3. Chronic atrial fibrillation rate controlled 4. Bilateral pneumonia treated 5. Metabolic encephalopathy no change we will continue to follow along   I have personally seen and evaluated the patient, evaluated laboratory and imaging results, formulated the assessment and plan and placed orders. The Patient requires high complexity decision making with multiple systems involvement.  Rounds were done with the Respiratory Therapy Director and Staff therapists and discussed with nursing staff also.  [name], MD [facility] Pulmonary Critical Care Medicine Sleep Medicine

## 2020-10-24 LAB — COMPREHENSIVE METABOLIC PANEL
ALT: 24 U/L (ref 0–44)
AST: 29 U/L (ref 15–41)
Albumin: 3.1 g/dL — ABNORMAL LOW (ref 3.5–5.0)
Alkaline Phosphatase: 86 U/L (ref 38–126)
Anion gap: 12 (ref 5–15)
BUN: 12 mg/dL (ref 6–20)
CO2: 28 mmol/L (ref 22–32)
Calcium: 10 mg/dL (ref 8.9–10.3)
Chloride: 95 mmol/L — ABNORMAL LOW (ref 98–111)
Creatinine, Ser: 0.55 mg/dL — ABNORMAL LOW (ref 0.61–1.24)
GFR, Estimated: >60 mL/min (ref >60)
Glucose, Bld: 102 mg/dL — ABNORMAL HIGH (ref 70–99)
Potassium: 3.9 mmol/L (ref 3.5–5.1)
Sodium: 135 mmol/L (ref 135–145)
Total Bilirubin: 1.4 mg/dL — ABNORMAL HIGH (ref 0.3–1.2)
Total Protein: 7.1 g/dL (ref 6.5–8.1)

## 2020-10-24 LAB — TSH: TSH: 11.477 u[IU]/mL — ABNORMAL HIGH (ref 0.350–4.500)

## 2020-10-26 ENCOUNTER — Other Ambulatory Visit (HOSPITAL_COMMUNITY): Payer: Medicare Other

## 2020-10-27 ENCOUNTER — Other Ambulatory Visit (HOSPITAL_COMMUNITY): Payer: Medicare Other

## 2020-10-27 LAB — T4, FREE: Free T4: 1.48 ng/dL — ABNORMAL HIGH (ref 0.61–1.12)

## 2020-10-27 LAB — COMPREHENSIVE METABOLIC PANEL
ALT: 20 U/L (ref 0–44)
AST: 16 U/L (ref 15–41)
Albumin: 3.2 g/dL — ABNORMAL LOW (ref 3.5–5.0)
Alkaline Phosphatase: 88 U/L (ref 38–126)
Anion gap: 14 (ref 5–15)
BUN: 25 mg/dL — ABNORMAL HIGH (ref 6–20)
CO2: 26 mmol/L (ref 22–32)
Calcium: 10.1 mg/dL (ref 8.9–10.3)
Chloride: 93 mmol/L — ABNORMAL LOW (ref 98–111)
Creatinine, Ser: 0.62 mg/dL (ref 0.61–1.24)
GFR, Estimated: >60 mL/min (ref >60)
Glucose, Bld: 130 mg/dL — ABNORMAL HIGH (ref 70–99)
Potassium: 3.7 mmol/L (ref 3.5–5.1)
Sodium: 133 mmol/L — ABNORMAL LOW (ref 135–145)
Total Bilirubin: 1.5 mg/dL — ABNORMAL HIGH (ref 0.3–1.2)
Total Protein: 7.8 g/dL (ref 6.5–8.1)

## 2020-10-27 LAB — CBC
HCT: 36.8 % — ABNORMAL LOW (ref 39.0–52.0)
Hemoglobin: 12 g/dL — ABNORMAL LOW (ref 13.0–17.0)
MCH: 26.8 pg (ref 26.0–34.0)
MCHC: 32.6 g/dL (ref 30.0–36.0)
MCV: 82.1 fL (ref 80.0–100.0)
Platelets: 406 10*3/uL — ABNORMAL HIGH (ref 150–400)
RBC: 4.48 MIL/uL (ref 4.22–5.81)
RDW: 19.7 % — ABNORMAL HIGH (ref 11.5–15.5)
WBC: 26.3 10*3/uL — ABNORMAL HIGH (ref 4.0–10.5)
nRBC: 0 % (ref 0.0–0.2)

## 2020-10-27 LAB — TSH: TSH: 5.115 u[IU]/mL — ABNORMAL HIGH (ref 0.350–4.500)

## 2020-10-27 LAB — AMMONIA: Ammonia: 36 umol/L — ABNORMAL HIGH (ref 9–35)

## 2020-10-28 ENCOUNTER — Other Ambulatory Visit (HOSPITAL_COMMUNITY): Payer: Medicare Other

## 2020-10-28 LAB — LACTIC ACID, PLASMA
Lactic Acid, Venous: 4.6 mmol/L (ref 0.5–1.9)
Lactic Acid, Venous: 3.1 mmol/L (ref 0.5–1.9)

## 2020-10-28 LAB — BLOOD GAS, ARTERIAL
Acid-base deficit: 3.2 mmol/L — ABNORMAL HIGH (ref 0.0–2.0)
Acid-base deficit: 3.3 mmol/L — ABNORMAL HIGH (ref 0.0–2.0)
Bicarbonate: 24.4 mmol/L (ref 20.0–28.0)
Bicarbonate: 21.8 mmol/L (ref 20.0–28.0)
FIO2: 44
FIO2: 100
O2 Saturation: 87.1 %
O2 Saturation: 99.9 %
Patient temperature: 36.9
Patient temperature: 37.8
pCO2 arterial: 70.3 mmHg (ref 32.0–48.0)
pCO2 arterial: 45.2 mmHg (ref 32.0–48.0)
pH, Arterial: 7.165 — CL (ref 7.350–7.450)
pH, Arterial: 7.309 — ABNORMAL LOW (ref 7.350–7.450)
pO2, Arterial: 70.7 mmHg — ABNORMAL LOW (ref 83.0–108.0)
pO2, Arterial: 322 mmHg — ABNORMAL HIGH (ref 83.0–108.0)

## 2020-10-28 LAB — CBC
HCT: 43.1 % (ref 39.0–52.0)
Hemoglobin: 13.1 g/dL (ref 13.0–17.0)
MCH: 26.4 pg (ref 26.0–34.0)
MCHC: 30.4 g/dL (ref 30.0–36.0)
MCV: 86.9 fL (ref 80.0–100.0)
Platelets: 404 10*3/uL — ABNORMAL HIGH (ref 150–400)
RBC: 4.96 MIL/uL (ref 4.22–5.81)
RDW: 19.3 % — ABNORMAL HIGH (ref 11.5–15.5)
WBC: 25.8 10*3/uL — ABNORMAL HIGH (ref 4.0–10.5)
nRBC: 0 % (ref 0.0–0.2)

## 2020-10-28 MED ORDER — IOHEXOL 300 MG/ML  SOLN
100.0000 mL | Freq: Once | INTRAMUSCULAR | Status: AC | PRN
  Administered 2020-10-28: 100 mL via INTRAVENOUS

## 2020-10-29 ENCOUNTER — Other Ambulatory Visit (HOSPITAL_COMMUNITY): Payer: Medicare Other

## 2020-10-29 DIAGNOSIS — J690 Pneumonitis due to inhalation of food and vomit: Secondary | ICD-10-CM

## 2020-10-29 DIAGNOSIS — J189 Pneumonia, unspecified organism: Secondary | ICD-10-CM | POA: Diagnosis not present

## 2020-10-29 DIAGNOSIS — J9621 Acute and chronic respiratory failure with hypoxia: Secondary | ICD-10-CM | POA: Diagnosis not present

## 2020-10-29 DIAGNOSIS — Z978 Presence of other specified devices: Secondary | ICD-10-CM

## 2020-10-29 DIAGNOSIS — I482 Chronic atrial fibrillation, unspecified: Secondary | ICD-10-CM | POA: Diagnosis not present

## 2020-10-29 LAB — BLOOD GAS, ARTERIAL
Acid-base deficit: 1.3 mmol/L (ref 0.0–2.0)
Bicarbonate: 22.8 mmol/L (ref 20.0–28.0)
FIO2: 50
O2 Saturation: 99.2 %
Patient temperature: 37
pCO2 arterial: 38 mmHg (ref 32.0–48.0)
pH, Arterial: 7.396 (ref 7.350–7.450)
pO2, Arterial: 143 mmHg — ABNORMAL HIGH (ref 83.0–108.0)

## 2020-10-29 LAB — BASIC METABOLIC PANEL
Anion gap: 17 — ABNORMAL HIGH (ref 5–15)
BUN: 50 mg/dL — ABNORMAL HIGH (ref 6–20)
CO2: 21 mmol/L — ABNORMAL LOW (ref 22–32)
Calcium: 9.5 mg/dL (ref 8.9–10.3)
Chloride: 91 mmol/L — ABNORMAL LOW (ref 98–111)
Creatinine, Ser: 2.1 mg/dL — ABNORMAL HIGH (ref 0.61–1.24)
GFR, Estimated: 36 mL/min — ABNORMAL LOW (ref >60)
Glucose, Bld: 119 mg/dL — ABNORMAL HIGH (ref 70–99)
Potassium: 3.8 mmol/L (ref 3.5–5.1)
Sodium: 129 mmol/L — ABNORMAL LOW (ref 135–145)

## 2020-10-29 LAB — CBC
HCT: 32.1 % — ABNORMAL LOW (ref 39.0–52.0)
Hemoglobin: 10.6 g/dL — ABNORMAL LOW (ref 13.0–17.0)
MCH: 27.5 pg (ref 26.0–34.0)
MCHC: 33 g/dL (ref 30.0–36.0)
MCV: 83.2 fL (ref 80.0–100.0)
Platelets: 297 10*3/uL (ref 150–400)
RBC: 3.86 MIL/uL — ABNORMAL LOW (ref 4.22–5.81)
RDW: 19.3 % — ABNORMAL HIGH (ref 11.5–15.5)
WBC: 18.4 10*3/uL — ABNORMAL HIGH (ref 4.0–10.5)
nRBC: 0 % (ref 0.0–0.2)

## 2020-10-29 LAB — LACTIC ACID, PLASMA
Lactic Acid, Venous: 2.4 mmol/L (ref 0.5–1.9)
Lactic Acid, Venous: 1.5 mmol/L (ref 0.5–1.9)
Lactic Acid, Venous: 2.1 mmol/L (ref 0.5–1.9)
Lactic Acid, Venous: 1.2 mmol/L (ref 0.5–1.9)
Lactic Acid, Venous: 1.8 mmol/L (ref 0.5–1.9)

## 2020-10-29 NOTE — Consult Note (Signed)
Pulmonary Critical Care Medicine [facility] [place]   PULMONARY CRITICAL CARE SERVICE  CONSULT NOTE  Date of Service: 10/29/2020  Maurice Brennan  JAS:[id number]  DOB: 1966-02-06   DOA: 10/15/2020  Referring Physician: Carron Curie, MD  HPI: Maurice Brennan is a 55 y.o. male seen for follow up of Acute on Chronic Respiratory Failure as a reconsult.  Patient reintubated overnight and had gotten into respiratory failure again with PCO2 that had significantly elevated and severe acidosis.  In addition elevated white count was noted.  Felt to be septic initially thought that there may be possibly a GI issue going and that is also being followed by the primary care team.  Right now patient is on mechanical ventilation and assist control with an FiO2 of 35%.  Chest x-ray revealing some infiltrates multifocal issue patient possibly could have aspirated also.  CT scan results as already noted below   Past Medical History:  Diagnosis Date  . Alcohol abuse  . Anemia  . Anxiety  . BPH (benign prostatic hyperplasia)  . COPD (chronic obstructive pulmonary disease) (CMS-HCC)  . Depression  . GERD (gastroesophageal reflux disease)  . Hyperlipidemia  . Hypertension  . Lumbosacral disc disease  . Osteoarthritis  . Stroke (cerebrum) (CMS-HCC)   Past Surgical History  Past Surgical History:  Procedure Laterality Date  . APPENDECTOMY  . BACK SURGERY Bilateral  . COLONOSCOPY  . ESOPHAGOGASTRODUODENOSCOPY   Family History  The patient's family history includes Cancer in his mother..  Social History:  Social History   Socioeconomic History  . Marital status: Divorced  Spouse name: Not on file  . Number of children: Not on file  . Years of education: Not on file  . Highest education level: Not on file  Occupational History  . Not on file  Tobacco Use  . Smoking status: Current Every Day Smoker  Packs/day: 1.00  Years: 40.00  Pack years: 40.00  . Smokeless tobacco: Never Used   Substance and Sexual Activity  . Alcohol use: Yes  Alcohol/week: 24.0 standard drinks  Types: 24 Cans of beer per week  Comment: States he quit the 16th of last month  . Drug use: Not Currently  . Sexual activity: Not on file  Other Topics Concern  . Not on file  Social History Narrative  . Not on file    Medications: Reviewed on Rounds  Physical Exam:  Vitals: Temperature 97.6 pulse 84 respiratory 24 blood pressure 99/76 saturations 100%  Ventilator Settings on assist control FiO2 35% tidal volume 500 with a PEEP of 5  . General: Comfortable at this time . Eyes: Grossly normal lids, irises & conjunctiva . ENT: grossly tongue is normal . Neck: no obvious mass . Cardiovascular: S1 S2 normal no gallop . Respiratory: No rhonchi very coarse breath sounds are noted . Abdomen: soft . Skin: no rash seen on limited exam . Musculoskeletal: not rigid . Psychiatric:unable to assess . Neurologic: no seizure no involuntary movements         Lab Data:   Basic Metabolic Panel: Recent Labs  Lab 10/24/20 0504 10/27/20 1230 10/29/20 0208  NA 135 133* 129*  K 3.9 3.7 3.8  CL 95* 93* 91*  CO2 28 26 21*  GLUCOSE 102* 130* 119*  BUN 12 25* 50*  CREATININE 0.55* 0.62 2.10*  CALCIUM 10.0 10.1 9.5    ABG: Recent Labs  Lab 10/28/20 1831 10/28/20 2210 10/29/20 0807  PHART 7.165* 7.309* 7.396  PCO2ART 70.3* 45.2 38.0  PO2ART  70.7* 322* 143*  HCO3 24.4 21.8 22.8  O2SAT 87.1 99.9 99.2    Liver Function Tests: Recent Labs  Lab 10/24/20 0504 10/27/20 1230  AST 29 16  ALT 24 20  ALKPHOS 86 88  BILITOT 1.4* 1.5*  PROT 7.1 7.8  ALBUMIN 3.1* 3.2*   No results for input(s): LIPASE, AMYLASE in the last 168 hours. Recent Labs  Lab 10/23/20 0837 10/27/20 1230  AMMONIA 60* 36*    CBC: Recent Labs  Lab 10/27/20 1230 10/28/20 1926 10/29/20 0208  WBC 26.3* 25.8* 18.4*  HGB 12.0* 13.1 10.6*  HCT 36.8* 43.1 32.1*  MCV 82.1 86.9 83.2  PLT 406* 404* 297    Cardiac  Enzymes: No results for input(s): CKTOTAL, CKMB, CKMBINDEX, TROPONINI in the last 168 hours.  BNP (last 3 results) No results for input(s): BNP in the last 8760 hours.  ProBNP (last 3 results) No results for input(s): PROBNP in the last 8760 hours.  Radiological Exams: DG Abd 1 View  Result Date: 10/27/2020 CLINICAL DATA:  Abdominal pain EXAM: ABDOMEN - 1 VIEW COMPARISON:  None. FINDINGS: Gastrostomy catheter is noted within the stomach. Scattered large and small bowel gas is seen. Considerable fecal material is noted within the right colon consistent with a degree of constipation. Scattered diverticula are noted. Contrast from prior gastrostomy check is seen within the distal colon. Postsurgical changes in the lower thoracic spine are seen. IMPRESSION: Changes consistent with constipation.  No obstructive changes noted. Diverticulosis without diverticulitis. Electronically Signed   By: Alcide Clever M.D.   On: 10/27/2020 11:22   CT ABDOMEN PELVIS W CONTRAST  Result Date: 10/28/2020 CLINICAL DATA:  Abdominal pain, fever, history of alcohol abuse, leukocytosis EXAM: CT ABDOMEN AND PELVIS WITH CONTRAST TECHNIQUE: Multidetector CT imaging of the abdomen and pelvis was performed using the standard protocol following bolus administration of intravenous contrast. CONTRAST:  OMNIPAQUE IOHEXOL 300 MG/ML  SOLN COMPARISON:  10/26/2020 FINDINGS: Lower chest: There are bibasilar areas of consolidation. Within the lower lobes, there are denser areas of consolidation which may reflect atelectasis. Background reticulonodular ground-glass opacities may reflect infection or inflammation. No effusion or pneumothorax. Trace pericardial effusion. The heart is not enlarged. Extensive atherosclerosis of the coronary vasculature. Hepatobiliary: Minimal high attenuation within the gallbladder lumen may reflect sludge. No calcified gallstones or cholecystitis. Liver is unremarkable. Pancreas: Unremarkable. No pancreatic  ductal dilatation or surrounding inflammatory changes. Spleen: Normal in size without focal abnormality. Adrenals/Urinary Tract: There are bilateral renal cortical cysts. Otherwise the kidneys enhance normally and symmetrically. No urinary tract calculi or obstructive uropathy. Bladder is decompressed, limiting its evaluation. The adrenals are normal. Stomach/Bowel: There is marked distension of the stomach, small bowel, proximal colon. In addition, there is evidence of pneumatosis extending from the cecum through the mid transverse colon. No evidence of bowel wall thickening however. No evidence of portal venous gas. Diverticulosis of the descending and sigmoid colon without diverticulitis. Percutaneous gastrostomy tube is seen within the gastric lumen. Vascular/Lymphatic: There is extensive atherosclerosis throughout the aorta and its branches. Portal vein remains patent, with no evidence of portal venous gas. No pathologic adenopathy. Reproductive: Prostate is unremarkable. Other: There is a small amount of free fluid within the right upper quadrant, right pericolic gutter, and lower pelvis. No evidence of pneumoperitoneum. No abdominal wall hernia. Musculoskeletal: There are no acute or destructive bony lesions. Postsurgical changes are seen within the lower thoracic spine. Reconstructed images demonstrate no additional findings. IMPRESSION: 1. Distended ascending and proximal transverse colon, with  diffuse pneumatosis as above. Differential includes bowel ischemia or gas-forming infection. No evidence of portal venous gas. 2. Diffuse distension of the stomach, small bowel, and proximal colon as above. No obstructing lesion is identified, and findings may reflect diffuse ileus. 3. Trace free fluid within the right flank and lower pelvis. 4. Patchy bibasilar consolidation. Denser areas are likely atelectasis. Scattered reticulonodular opacities may be inflammatory or infectious. Critical Value/emergent results  were called by telephone at the time of interpretation on 10/28/2020 at 5:52 pm to provider [facility] , who verbally acknowledged these results. Electronically Signed   By: Sharlet Salina M.D.   On: 10/28/2020 17:52   DG CHEST PORT 1 VIEW  Result Date: 10/28/2020 CLINICAL DATA:  Endotracheal tube placement EXAM: PORTABLE CHEST 1 VIEW COMPARISON:  October 27, 2020 FINDINGS: The endotracheal tube is well positioned above the carina. The right-sided central venous catheter projects over the right atrium, stable from prior study. The heart size is unchanged. There is vascular congestion with findings suggestive of developing interstitial edema. There are scattered airspace opacities at the lung bases. No pneumothorax. IMPRESSION: 1. Lines and tubes as above. 2. Developing interstitial edema. 3. Scattered bibasilar airspace opacities concerning for aspiration or multifocal pneumonia. Electronically Signed   By: Katherine Mantle M.D.   On: 10/28/2020 20:40   DG CHEST PORT 1 VIEW  Result Date: 10/27/2020 CLINICAL DATA:  Leukocytosis. EXAM: PORTABLE CHEST 1 VIEW COMPARISON:  October 15, 2020. FINDINGS: Stable cardiomediastinal silhouette. Tracheostomy tube has been removed. Right internal jugular catheter is noted with tip in the expected position of the junction of the inferior vena cava and right atrium. No pneumothorax or pleural effusion is noted. Minimal bibasilar subsegmental atelectasis is noted. Bony thorax is unremarkable. IMPRESSION: Minimal bibasilar subsegmental atelectasis. Tracheostomy tube has been removed. Electronically Signed   By: Lupita Raider M.D.   On: 10/27/2020 15:25    Assessment/Plan Active Problems:   Acute on chronic respiratory failure with hypoxia (HCC)   COPD, severe (HCC)   Chronic atrial fibrillation (HCC)   Bilateral pneumonia   Metabolic encephalopathy   1. Acute on chronic respiratory failure hypoxia at this time patient is back on the ventilator ABG results as  noted above.  Chest x-ray had shown minimal atelectasis.  There is some interstitial infiltrates on the latest film could be secondary to aspiration and/or multifocal pneumonia.  I personally reviewed the chest film myself patient also had a CT of the abdomen done which revealed diffuse distention of the stomach small bowel proximal colon no obvious obstructive lesions.  There was also noted some patchy basilar consolidation 2. Bilateral pneumonia probable aspiration at this point would recommend review of antibiotic therapy.  We will continue with the ventilator support for now. 3. Chronic atrial fibrillation rate is controlled 4. Encephalopathy right now is intubated 5. Severe COPD medical management.   I have personally seen and evaluated the patient, evaluated laboratory and imaging results, formulated the assessment and plan and placed orders. The Patient requires high complexity decision making with multiple systems involvement.  Rounds were done with the Respiratory Therapy Director and Staff therapists and discussed with nursing staff also.  [name], MD [facility] Pulmonary Critical Care Medicine Sleep Medicine

## 2020-10-30 ENCOUNTER — Other Ambulatory Visit (HOSPITAL_COMMUNITY): Payer: Medicare Other

## 2020-10-30 DIAGNOSIS — J9621 Acute and chronic respiratory failure with hypoxia: Secondary | ICD-10-CM | POA: Diagnosis not present

## 2020-10-30 DIAGNOSIS — J690 Pneumonitis due to inhalation of food and vomit: Secondary | ICD-10-CM | POA: Diagnosis not present

## 2020-10-30 DIAGNOSIS — J189 Pneumonia, unspecified organism: Secondary | ICD-10-CM | POA: Diagnosis not present

## 2020-10-30 DIAGNOSIS — I482 Chronic atrial fibrillation, unspecified: Secondary | ICD-10-CM | POA: Diagnosis not present

## 2020-10-30 LAB — CBC
HCT: 28.4 % — ABNORMAL LOW (ref 39.0–52.0)
Hemoglobin: 9.5 g/dL — ABNORMAL LOW (ref 13.0–17.0)
MCH: 27.3 pg (ref 26.0–34.0)
MCHC: 33.5 g/dL (ref 30.0–36.0)
MCV: 81.6 fL (ref 80.0–100.0)
Platelets: 285 10*3/uL (ref 150–400)
RBC: 3.48 MIL/uL — ABNORMAL LOW (ref 4.22–5.81)
RDW: 19.5 % — ABNORMAL HIGH (ref 11.5–15.5)
WBC: 12.3 10*3/uL — ABNORMAL HIGH (ref 4.0–10.5)
nRBC: 0 % (ref 0.0–0.2)

## 2020-10-30 LAB — COMPREHENSIVE METABOLIC PANEL
ALT: 14 U/L (ref 0–44)
AST: 18 U/L (ref 15–41)
Albumin: 3 g/dL — ABNORMAL LOW (ref 3.5–5.0)
Alkaline Phosphatase: 67 U/L (ref 38–126)
Anion gap: 12 (ref 5–15)
BUN: 47 mg/dL — ABNORMAL HIGH (ref 6–20)
CO2: 23 mmol/L (ref 22–32)
Calcium: 8.9 mg/dL (ref 8.9–10.3)
Chloride: 94 mmol/L — ABNORMAL LOW (ref 98–111)
Creatinine, Ser: 1.3 mg/dL — ABNORMAL HIGH (ref 0.61–1.24)
GFR, Estimated: >60 mL/min (ref >60)
Glucose, Bld: 95 mg/dL (ref 70–99)
Potassium: 3.1 mmol/L — ABNORMAL LOW (ref 3.5–5.1)
Sodium: 129 mmol/L — ABNORMAL LOW (ref 135–145)
Total Bilirubin: 1.3 mg/dL — ABNORMAL HIGH (ref 0.3–1.2)
Total Protein: 6.4 g/dL — ABNORMAL LOW (ref 6.5–8.1)

## 2020-10-30 NOTE — Progress Notes (Addendum)
Pulmonary Critical Care Medicine [facility] [place]   PULMONARY CRITICAL CARE SERVICE  PROGRESS NOTE  Date of Service: 10/30/2020  Maurice Brennan  GOT:[id number]  DOB: [birth year]   DOA: 10/15/2020  Referring Physician: Carron Curie, MD  HPI: Maurice Brennan is a 55 y.o. male seen for follow up of Acute on Chronic Respiratory Failure.  Patient remains intubated on the ventilator.  Right now is on assist control mode has been requiring 35% FiO2.  Spoke with respiratory therapy during rounds to try to wean protocol  Medications: Reviewed on Rounds  Physical Exam:  Vitals: Temperature is 97.4 pulse 97 respiratory 20 blood pressure is 110/62 saturations 98%  Ventilator Settings on assist control FiO2 35% tidal volume 500 PEEP of 5  . General: Comfortable at this time . Eyes: Grossly normal lids, irises & conjunctiva . ENT: grossly tongue is normal . Neck: no obvious mass . Cardiovascular: S1 S2 normal no gallop . Respiratory: Scattered rhonchi expansion is equal at this time . Abdomen: soft . Skin: no rash seen on limited exam . Musculoskeletal: not rigid . Psychiatric:unable to assess . Neurologic: no seizure no involuntary movements         Lab Data:   Basic Metabolic Panel: Recent Labs  Lab 10/24/20 0504 10/27/20 1230 10/29/20 0208 10/30/20 0429  NA 135 133* 129* 129*  K 3.9 3.7 3.8 3.1*  CL 95* 93* 91* 94*  CO2 28 26 21* 23  GLUCOSE 102* 130* 119* 95  BUN 12 25* 50* 47*  CREATININE 0.55* 0.62 2.10* 1.30*  CALCIUM 10.0 10.1 9.5 8.9    ABG: Recent Labs  Lab 10/28/20 1831 10/28/20 2210 10/29/20 0807  PHART 7.165* 7.309* 7.396  PCO2ART 70.3* 45.2 38.0  PO2ART 70.7* 322* 143*  HCO3 24.4 21.8 22.8  O2SAT 87.1 99.9 99.2    Liver Function Tests: Recent Labs  Lab 10/24/20 0504 10/27/20 1230 10/30/20 0429  AST 29 16 18   ALT 24 20 14   ALKPHOS 86 88 67  BILITOT 1.4* 1.5* 1.3*  PROT 7.1 7.8 6.4*  ALBUMIN 3.1* 3.2* 3.0*   No results for input(s):  LIPASE, AMYLASE in the last 168 hours. Recent Labs  Lab 10/27/20 1230  AMMONIA 36*    CBC: Recent Labs  Lab 10/27/20 1230 10/28/20 1926 10/29/20 0208 10/30/20 0429  WBC 26.3* 25.8* 18.4* 12.3*  HGB 12.0* 13.1 10.6* 9.5*  HCT 36.8* 43.1 32.1* 28.4*  MCV 82.1 86.9 83.2 81.6  PLT 406* 404* 297 285    Cardiac Enzymes: No results for input(s): CKTOTAL, CKMB, CKMBINDEX, TROPONINI in the last 168 hours.  BNP (last 3 results) No results for input(s): BNP in the last 8760 hours.  ProBNP (last 3 results) No results for input(s): PROBNP in the last 8760 hours.  Radiological Exams: DG Abd 1 View  Result Date: 10/30/2020 CLINICAL DATA:  Gaseous distension large and small bowel loops EXAM: ABDOMEN - 1 VIEW COMPARISON:  October 29, 2020 x-ray.  October 28, 2020 CT scan. FINDINGS: Mild atelectasis in the left base. No free air or portal venous gas seen on supine imaging. Gaseous distension of the colon. Pneumatosis in the ascending and proximal transverse colon persists, unchanged. No other interval changes. IMPRESSION: 1. Persistent distension of the colon throughout its length. 2. Pneumatosis in the ascending colon and proximal transverse colon is stable. 3. No other acute abnormalities. Electronically Signed   By: October 31, 2020 III M.D   On: 10/30/2020 08:29   CT ABDOMEN PELVIS W CONTRAST  Result  Date: 10/28/2020 CLINICAL DATA:  Abdominal pain, fever, history of alcohol abuse, leukocytosis EXAM: CT ABDOMEN AND PELVIS WITH CONTRAST TECHNIQUE: Multidetector CT imaging of the abdomen and pelvis was performed using the standard protocol following bolus administration of intravenous contrast. CONTRAST:  OMNIPAQUE IOHEXOL 300 MG/ML  SOLN COMPARISON:  10/26/2020 FINDINGS: Lower chest: There are bibasilar areas of consolidation. Within the lower lobes, there are denser areas of consolidation which may reflect atelectasis. Background reticulonodular ground-glass opacities may reflect infection or  inflammation. No effusion or pneumothorax. Trace pericardial effusion. The heart is not enlarged. Extensive atherosclerosis of the coronary vasculature. Hepatobiliary: Minimal high attenuation within the gallbladder lumen may reflect sludge. No calcified gallstones or cholecystitis. Liver is unremarkable. Pancreas: Unremarkable. No pancreatic ductal dilatation or surrounding inflammatory changes. Spleen: Normal in size without focal abnormality. Adrenals/Urinary Tract: There are bilateral renal cortical cysts. Otherwise the kidneys enhance normally and symmetrically. No urinary tract calculi or obstructive uropathy. Bladder is decompressed, limiting its evaluation. The adrenals are normal. Stomach/Bowel: There is marked distension of the stomach, small bowel, proximal colon. In addition, there is evidence of pneumatosis extending from the cecum through the mid transverse colon. No evidence of bowel wall thickening however. No evidence of portal venous gas. Diverticulosis of the descending and sigmoid colon without diverticulitis. Percutaneous gastrostomy tube is seen within the gastric lumen. Vascular/Lymphatic: There is extensive atherosclerosis throughout the aorta and its branches. Portal vein remains patent, with no evidence of portal venous gas. No pathologic adenopathy. Reproductive: Prostate is unremarkable. Other: There is a small amount of free fluid within the right upper quadrant, right pericolic gutter, and lower pelvis. No evidence of pneumoperitoneum. No abdominal wall hernia. Musculoskeletal: There are no acute or destructive bony lesions. Postsurgical changes are seen within the lower thoracic spine. Reconstructed images demonstrate no additional findings. IMPRESSION: 1. Distended ascending and proximal transverse colon, with diffuse pneumatosis as above. Differential includes bowel ischemia or gas-forming infection. No evidence of portal venous gas. 2. Diffuse distension of the stomach, small bowel,  and proximal colon as above. No obstructing lesion is identified, and findings may reflect diffuse ileus. 3. Trace free fluid within the right flank and lower pelvis. 4. Patchy bibasilar consolidation. Denser areas are likely atelectasis. Scattered reticulonodular opacities may be inflammatory or infectious. Critical Value/emergent results were called by telephone at the time of interpretation on 10/28/2020 at 5:52 pm to provider [facility] , who verbally acknowledged these results. Electronically Signed   By: Sharlet Salina M.D.   On: 10/28/2020 17:52   DG CHEST PORT 1 VIEW  Result Date: 10/30/2020 CLINICAL DATA:  Respiratory dependent. Bibasilar opacities. Follow-up. EXAM: PORTABLE CHEST 1 VIEW COMPARISON:  October 28, 2020 FINDINGS: The ETT is in good position. A right central line terminates in the region the right atrium, unchanged. The cardiomediastinal silhouette is stable. No pneumothorax. No focal infiltrate on the right. Mild opacity in left base is somewhat platelike suggesting atelectasis. No other acute interval changes. IMPRESSION: 1. Support apparatus as above. 2. Probable atelectasis in the left base. No other acute abnormalities. Electronically Signed   By: Gerome Sam III M.D   On: 10/30/2020 08:27   DG CHEST PORT 1 VIEW  Result Date: 10/28/2020 CLINICAL DATA:  Endotracheal tube placement EXAM: PORTABLE CHEST 1 VIEW COMPARISON:  October 27, 2020 FINDINGS: The endotracheal tube is well positioned above the carina. The right-sided central venous catheter projects over the right atrium, stable from prior study. The heart size is unchanged. There is vascular congestion  with findings suggestive of developing interstitial edema. There are scattered airspace opacities at the lung bases. No pneumothorax. IMPRESSION: 1. Lines and tubes as above. 2. Developing interstitial edema. 3. Scattered bibasilar airspace opacities concerning for aspiration or multifocal pneumonia. Electronically Signed   By:  Katherine Mantle M.D.   On: 10/28/2020 20:40   DG Abd Portable 1V  Result Date: 10/29/2020 CLINICAL DATA:  Distended abdomen EXAM: PORTABLE ABDOMEN - 1 VIEW COMPARISON:  10/27/2020 FINDINGS: Gastrostomy tube is noted within the left upper quadrant of the abdomen. Enteric contrast material is identified within the sigmoid colon and rectum. Intravenous contrast material from recent CT is identified within the urinary bladder. Gaseous distension of the large and small bowel loops is again noted. Decrease in the appearance of pneumatosis involving the ascending and transverse colon. IMPRESSION: 1. Persistent gaseous distension of the large and small bowel loops compatible. 2. Decrease in pneumatosis involving the ascending and transverse colon. Electronically Signed   By: Signa Kell M.D.   On: 10/29/2020 14:42    Assessment/Plan Active Problems:   Acute on chronic respiratory failure with hypoxia (HCC)   COPD, severe (HCC)   Chronic atrial fibrillation (HCC)   Bilateral pneumonia   Metabolic encephalopathy   1. Acute on chronic respiratory failure hypoxia we will continue with assist control mode titrate oxygen as tolerated continue secretion management supportive care.  ABG looks good respiratory therapy will check the RSB I mechanics.  Patient otherwise appears to have improved somewhat 2. Severe COPD medical management 3. Chronic atrial fibrillation rate is controlled 4. Bilateral pneumonia treated we will continue to follow 5. Metabolic encephalopathy patient is at baseline   I have personally seen and evaluated the patient, evaluated laboratory and imaging results, formulated the assessment and plan and placed orders. The Patient requires high complexity decision making with multiple systems involvement.  Rounds were done with the Respiratory Therapy Director and Staff therapists and discussed with nursing staff also.  [name], MD [facility] Pulmonary Critical Care Medicine Sleep  Medicine

## 2020-10-31 LAB — BASIC METABOLIC PANEL
Anion gap: 12 (ref 5–15)
BUN: 31 mg/dL — ABNORMAL HIGH (ref 6–20)
CO2: 21 mmol/L — ABNORMAL LOW (ref 22–32)
Calcium: 8.2 mg/dL — ABNORMAL LOW (ref 8.9–10.3)
Chloride: 99 mmol/L (ref 98–111)
Creatinine, Ser: 0.67 mg/dL (ref 0.61–1.24)
GFR, Estimated: >60 mL/min (ref >60)
Glucose, Bld: 84 mg/dL (ref 70–99)
Potassium: 3.2 mmol/L — ABNORMAL LOW (ref 3.5–5.1)
Sodium: 132 mmol/L — ABNORMAL LOW (ref 135–145)

## 2020-10-31 LAB — CBC
HCT: 29 % — ABNORMAL LOW (ref 39.0–52.0)
Hemoglobin: 9.5 g/dL — ABNORMAL LOW (ref 13.0–17.0)
MCH: 27.3 pg (ref 26.0–34.0)
MCHC: 32.8 g/dL (ref 30.0–36.0)
MCV: 83.3 fL (ref 80.0–100.0)
Platelets: 249 10*3/uL (ref 150–400)
RBC: 3.48 MIL/uL — ABNORMAL LOW (ref 4.22–5.81)
RDW: 19.3 % — ABNORMAL HIGH (ref 11.5–15.5)
WBC: 10.6 10*3/uL — ABNORMAL HIGH (ref 4.0–10.5)
nRBC: 0 % (ref 0.0–0.2)

## 2020-10-31 LAB — MAGNESIUM: Magnesium: 1.6 mg/dL — ABNORMAL LOW (ref 1.7–2.4)

## 2020-10-31 NOTE — Consult Note (Signed)
Infectious Disease Consultation   Omarius Grantham  DUK:[id number]  DOB: July 19, 1966  DOA: 10/15/2020  Requesting physician: Dr. Manson Passey  Reason for consultation: Antibiotic recommendations   History of Present Illness: Jadien Lehigh is an 55 y.o. male who presented to [facility] healthcare on 08/28/2020 with dyspnea, nausea, vomiting.  When he presented to the acute facility his pulse oximetry was in the upper 60s.  He apparently also had right-sided abdominal pain.  In the ED he was noted to be tachycardic.  He was started on Cardizem infusion.  He was placed on BiPAP.  However, continued to have worsening respiratory distress.  CT imaging of the chest abdomen and pelvis showed moderate right pleural effusion, trace left pleural effusion, small pericardial effusion, enlarged heart, airspace opacities in the right lower lobe, peribronchovascular groundglass opacities in the bilateral upper lobe with bronchial wall thickening.  He was also noted to have hepatic steatosis with fatty atrophy of the pancreas and ascites.  He was started on meropenem for empiric SBP coverage.  He was treated with Lasix, Aldactone.  His hospital course was complicated, he also had acute renal failure.  His respite status worsened and he required intubation on 09/02/2020.  Transthoracic echocardiogram showed stage III diastolic dysfunction, moderate to severe MR, pulmonary hypertension.  On 09/05/2020 patient apparently self extubated.  He was then placed on BiPAP.  Unfortunately worsened and had to be reintubated, required vasopressors.  He also had worsening leukocytosis.  His cultures were negative but his Fungitell on 09/14/2020 was positive.  His antimicrobials were broadened to vancomycin, meropenem, micafungin which he completed.  Eventually had tracheostomy and PEG placement due to failure to wean.  He had atrial fibrillation and was started on amiodarone.  Due to his complex medical problems he is transferred and admitted to [facility] on 10/15/2020.  After admission here he had tracheal aspirate cultures that showed Pseudomonas aeruginosa.  He was treated with vancomycin, meropenem.  He had rapid response on 10/28/2020 and had to be intubated.  Subsequently had worsening leukocytosis therefore was started on ciprofloxacin.  Flagyl was added for anaerobic coverage.  He also had worsening abdominal distention. CT of the abdomen and pelvis with contrast on 10/28/2020 per report distended ascending and proximal transverse colon with diffuse pneumatosis, concerning for diffuse ileus.  He is currently on 35% FiO2, 5 of PEEP.  He is awake able to nod when asked questions.  He is having abdominal distention.  He is currently n.p.o.  Review of Systems:  Review of systems negative except as mentioned above in the HPI.  Past Medical History: . Alcohol abuse  . Anemia  . Anxiety  . BPH (benign prostatic hyperplasia)  . COPD (chronic obstructive pulmonary disease)  . Depression  . GERD (gastroesophageal reflux disease)  . Hyperlipidemia  . Hypertension  . Lumbosacral disc disease  . Osteoarthritis  . Stroke (cerebrum)   Past Surgical History: . APPENDECTOMY  . BACK SURGERY Bilateral  . COLONOSCOPY  . ESOPHAGOGASTRODUODENOSCOPY   Allergies: Penicillins  Social History: reports that he has been smoking. He has a 40.00 pack-year smoking history. He has never used smokeless tobacco. Alcohol use: reports current alcohol use of about 24.0 standard drinks of alcohol per week. Drug use: reports previous drug use.  Family History: Cancer Mother   Physical Exam: Vitals: Temperature 97, pulse 86, respiratory rate 25, blood pressure 100/55, pulse oximetry 100% Constitutional: Ill-appearing male, awake, orally intubated Head: Atraumatic,  normocephalic Eyes: PERLA, EOMI, irises appear normal, anicteric sclera,  ENMT: external ears and nose appear normal, normal hearing, orally intubated Neck: Supple CVS:  S1-S2 Respiratory: Rhonchi, no wheezing.  Abdomen: Distended, bowel sounds heard, nontender Musculoskeletal: No edema, left toe discoloration Neuro: Awake, debility with generalized weakness Psych: stable mood and affect, mental status Skin: no rashes   Data reviewed:  I have personally reviewed following labs and imaging studies Labs:  CBC: Recent Labs  Lab 10/27/20 1230 10/28/20 1926 10/29/20 0208 10/30/20 0429 10/31/20 0351  WBC 26.3* 25.8* 18.4* 12.3* 10.6*  HGB 12.0* 13.1 10.6* 9.5* 9.5*  HCT 36.8* 43.1 32.1* 28.4* 29.0*  MCV 82.1 86.9 83.2 81.6 83.3  PLT 406* 404* 297 285 249    Basic Metabolic Panel: Recent Labs  Lab 10/27/20 1230 10/29/20 0208 10/30/20 0429 10/31/20 0813  NA 133* 129* 129* 132*  K 3.7 3.8 3.1* 3.2*  CL 93* 91* 94* 99  CO2 26 21* 23 21*  GLUCOSE 130* 119* 95 84  BUN 25* 50* 47* 31*  CREATININE 0.62 2.10* 1.30* 0.67  CALCIUM 10.1 9.5 8.9 8.2*  MG  --   --   --  1.6*   GFR CrCl cannot be calculated (Unknown ideal weight.). Liver Function Tests: Recent Labs  Lab 10/27/20 1230 10/30/20 0429  AST 16 18  ALT 20 14  ALKPHOS 88 67  BILITOT 1.5* 1.3*  PROT 7.8 6.4*  ALBUMIN 3.2* 3.0*   No results for input(s): LIPASE, AMYLASE in the last 168 hours. Recent Labs  Lab 10/27/20 1230  AMMONIA 36*   Coagulation profile No results for input(s): INR, PROTIME in the last 168 hours.  Cardiac Enzymes: No results for input(s): CKTOTAL, CKMB, CKMBINDEX, TROPONINI in the last 168 hours. BNP: Invalid input(s): POCBNP CBG: No results for input(s): GLUCAP in the last 168 hours. D-Dimer No results for input(s): DDIMER in the last 72 hours. Hgb A1c No results for input(s): HGBA1C in the last 72 hours. Lipid Profile No results for input(s): CHOL, HDL, LDLCALC, TRIG, CHOLHDL, LDLDIRECT in the last 72 hours. Thyroid function studies No results for input(s): TSH, T4TOTAL, T3FREE, THYROIDAB in the last 72 hours.  Invalid input(s): FREET3 Anemia  work up No results for input(s): VITAMINB12, FOLATE, FERRITIN, TIBC, IRON, RETICCTPCT in the last 72 hours. Urinalysis No results found for: COLORURINE, APPEARANCEUR, LABSPEC, PHURINE, GLUCOSEU, HGBUR, BILIRUBINUR, KETONESUR, PROTEINUR, UROBILINOGEN, NITRITE, LEUKOCYTESUR   Sepsis Labs Invalid input(s): PROCALCITONIN,  WBC,  LACTICIDVEN Microbiology No results found for this or any previous visit (from the past 240 hour(s)).     Inpatient Medications:   Please see MAR   Radiological Exams on Admission: DG Abd 1 View  Result Date: 10/30/2020 CLINICAL DATA:  Gaseous distension large and small bowel loops EXAM: ABDOMEN - 1 VIEW COMPARISON:  October 29, 2020 x-ray.  October 28, 2020 CT scan. FINDINGS: Mild atelectasis in the left base. No free air or portal venous gas seen on supine imaging. Gaseous distension of the colon. Pneumatosis in the ascending and proximal transverse colon persists, unchanged. No other interval changes. IMPRESSION: 1. Persistent distension of the colon throughout its length. 2. Pneumatosis in the ascending colon and proximal transverse colon is stable. 3. No other acute abnormalities. Electronically Signed   By: Gerome Sam III M.D   On: 10/30/2020 08:29   DG CHEST PORT 1 VIEW  Result Date: 10/30/2020 CLINICAL DATA:  Respiratory dependent. Bibasilar opacities. Follow-up. EXAM: PORTABLE CHEST 1 VIEW COMPARISON:  October 28, 2020 FINDINGS: The  ETT is in good position. A right central line terminates in the region the right atrium, unchanged. The cardiomediastinal silhouette is stable. No pneumothorax. No focal infiltrate on the right. Mild opacity in left base is somewhat platelike suggesting atelectasis. No other acute interval changes. IMPRESSION: 1. Support apparatus as above. 2. Probable atelectasis in the left base. No other acute abnormalities. Electronically Signed   By: Gerome Sam III M.D   On: 10/30/2020 08:27    Impression/Recommendations Active Problems:    Acute on chronic respiratory failure with hypoxia, ventilator dependent Sepsis Leukocytosis Pneumatosis of the ascending and proximal transverse colon with abdominal distention Pneumonia with Pseudomonas Diffuse ileus  COPD, severe   Chronic atrial fibrillation    Protein calorie malnutrition Acute kidney injury Alcoholic cirrhosis  Acute on chronic respiratory failure with hypoxemia: Patient currently intubated.  His previous respiratory culture showed Pseudomonas for which she was already treated.  However, now having other issues including sepsis, pneumatosis with abdominal distention/diffuse ileus.  Therefore suggested to discontinue the ciprofloxacin and start him on meropenem, Flagyl added for anaerobic coverage.  Pulmonary following.  Sepsis: In the setting of abdominal distention with concern for ileus/pneumatosis of the ascending and proximal transverse colon.  Suggest to change to IV meropenem.  His CT was done on 10/28/2020.  He currently is n.p.o. the PEG tube is hooked up to low suction.  Continue to monitor closely.  Continue antibiotics.  Plan to treat for duration of 7-10 days.  For his pneumatosis and diffuse ileus suggest to repeat CT of the abdomen and pelvis in the next 2 to 3 days to evaluate.  If worsening consider surgical consultation.  He also has leukocytosis.  Please monitor CBC. Please monitor BUN/cr closely while on antibiotics.  Pneumonia: His respiratory culture showed Pseudomonas.  He was already treated for that.  Recent CT of the abdomen and pelvis done on 10/28/2020 still showing patchy bibasilar consolidation. Currently on antibiotics again for the above-mentioned reason.  Continue to monitor closely.  He is now intubated.  High risk for recurrent pneumonia/tracheobronchitis.  If his respiratory status worsens suggest chest imaging preferably chest CT to better evaluate and send for repeat respiratory cultures.  COPD: Continue medication and management per the  primary team.  Atrial fibrillation: Continue medications and management per the primary team.  Protein calorie malnutrition: Management per the primary team.  Acute kidney injury: In the setting of sepsis.  IV fluids per the primary team.  Please monitor BUN/trending closely especially while on antibiotics.  Further management per primary team.  Alcoholic cirrhosis: He is high risk for ascites from the cirrhosis.  He is also high risk for hyperammonemia and hepatic encephalopathy.  Per primary team on lactulose.  Unfortunately due to his complex medical problems he is very high risk for worsening and decompensation.  Thank you for this consultation.  Plan of care discussed with the patient, primary team and pharmacy.  Vonzella Nipple M.D. 10/31/2020, 7:01 PM

## 2020-11-01 ENCOUNTER — Other Ambulatory Visit (HOSPITAL_COMMUNITY): Payer: Medicare Other

## 2020-11-01 LAB — CBC
HCT: 29 % — ABNORMAL LOW (ref 39.0–52.0)
Hemoglobin: 9.3 g/dL — ABNORMAL LOW (ref 13.0–17.0)
MCH: 27 pg (ref 26.0–34.0)
MCHC: 32.1 g/dL (ref 30.0–36.0)
MCV: 84.1 fL (ref 80.0–100.0)
Platelets: 229 10*3/uL (ref 150–400)
RBC: 3.45 MIL/uL — ABNORMAL LOW (ref 4.22–5.81)
RDW: 19 % — ABNORMAL HIGH (ref 11.5–15.5)
WBC: 10.1 10*3/uL (ref 4.0–10.5)
nRBC: 0 % (ref 0.0–0.2)

## 2020-11-01 LAB — MAGNESIUM: Magnesium: 1.7 mg/dL (ref 1.7–2.4)

## 2020-11-01 LAB — BASIC METABOLIC PANEL
Anion gap: 11 (ref 5–15)
BUN: 19 mg/dL (ref 6–20)
CO2: 24 mmol/L (ref 22–32)
Calcium: 8.2 mg/dL — ABNORMAL LOW (ref 8.9–10.3)
Chloride: 99 mmol/L (ref 98–111)
Creatinine, Ser: 0.53 mg/dL — ABNORMAL LOW (ref 0.61–1.24)
GFR, Estimated: >60 mL/min (ref >60)
Glucose, Bld: 94 mg/dL (ref 70–99)
Potassium: 2.8 mmol/L — ABNORMAL LOW (ref 3.5–5.1)
Sodium: 134 mmol/L — ABNORMAL LOW (ref 135–145)

## 2020-11-02 DIAGNOSIS — J690 Pneumonitis due to inhalation of food and vomit: Secondary | ICD-10-CM | POA: Diagnosis not present

## 2020-11-02 DIAGNOSIS — J9621 Acute and chronic respiratory failure with hypoxia: Secondary | ICD-10-CM | POA: Diagnosis not present

## 2020-11-02 DIAGNOSIS — I482 Chronic atrial fibrillation, unspecified: Secondary | ICD-10-CM | POA: Diagnosis not present

## 2020-11-02 DIAGNOSIS — J449 Chronic obstructive pulmonary disease, unspecified: Secondary | ICD-10-CM | POA: Diagnosis not present

## 2020-11-02 LAB — POTASSIUM: Potassium: 3.4 mmol/L — ABNORMAL LOW (ref 3.5–5.1)

## 2020-11-02 LAB — MAGNESIUM: Magnesium: 1.4 mg/dL — ABNORMAL LOW (ref 1.7–2.4)

## 2020-11-02 NOTE — Progress Notes (Signed)
Pulmonary Critical Care Medicine [facility] [place]   PULMONARY CRITICAL CARE SERVICE  PROGRESS NOTE  Date of Service: 11/02/2020  Maurice Brennan  SWN:[id number]  DOB: [birth year]   DOA: 10/15/2020  Referring Physician: Carron Curie, MD  HPI: Maurice Brennan is a 55 y.o. male seen for follow up of Acute on Chronic Respiratory Failure.  Patient was able to do 12 hours of pressure support yesterday today's goal of 16 hours on the pressure support  Medications: Reviewed on Rounds  Physical Exam:  Vitals: Temperature is 97.0 pulse 65 respiratory 15 blood pressure is 102/58 saturations 100%  Ventilator Settings on pressure support 12/5  . General: Comfortable at this time . Eyes: Grossly normal lids, irises & conjunctiva . ENT: grossly tongue is normal . Neck: no obvious mass . Cardiovascular: S1 S2 normal no gallop . Respiratory: No rhonchi very coarse breath sound . Abdomen: soft . Skin: no rash seen on limited exam . Musculoskeletal: not rigid . Psychiatric:unable to assess . Neurologic: no seizure no involuntary movements         Lab Data:   Basic Metabolic Panel: Recent Labs  Lab 10/27/20 1230 10/29/20 0208 10/30/20 0429 10/31/20 0813 11/01/20 0653  NA 133* 129* 129* 132* 134*  K 3.7 3.8 3.1* 3.2* 2.8*  CL 93* 91* 94* 99 99  CO2 26 21* 23 21* 24  GLUCOSE 130* 119* 95 84 94  BUN 25* 50* 47* 31* 19  CREATININE 0.62 2.10* 1.30* 0.67 0.53*  CALCIUM 10.1 9.5 8.9 8.2* 8.2*  MG  --   --   --  1.6* 1.7    ABG: Recent Labs  Lab 10/28/20 1831 10/28/20 2210 10/29/20 0807  PHART 7.165* 7.309* 7.396  PCO2ART 70.3* 45.2 38.0  PO2ART 70.7* 322* 143*  HCO3 24.4 21.8 22.8  O2SAT 87.1 99.9 99.2    Liver Function Tests: Recent Labs  Lab 10/27/20 1230 10/30/20 0429  AST 16 18  ALT 20 14  ALKPHOS 88 67  BILITOT 1.5* 1.3*  PROT 7.8 6.4*  ALBUMIN 3.2* 3.0*   No results for input(s): LIPASE, AMYLASE in the last 168 hours. Recent Labs  Lab 10/27/20 1230   AMMONIA 36*    CBC: Recent Labs  Lab 10/28/20 1926 10/29/20 0208 10/30/20 0429 10/31/20 0351 11/01/20 0653  WBC 25.8* 18.4* 12.3* 10.6* 10.1  HGB 13.1 10.6* 9.5* 9.5* 9.3*  HCT 43.1 32.1* 28.4* 29.0* 29.0*  MCV 86.9 83.2 81.6 83.3 84.1  PLT 404* 297 285 249 229    Cardiac Enzymes: No results for input(s): CKTOTAL, CKMB, CKMBINDEX, TROPONINI in the last 168 hours.  BNP (last 3 results) No results for input(s): BNP in the last 8760 hours.  ProBNP (last 3 results) No results for input(s): PROBNP in the last 8760 hours.  Radiological Exams: DG Abd 1 View  Result Date: 11/01/2020 CLINICAL DATA:  Abdominal distension EXAM: ABDOMEN - 1 VIEW COMPARISON:  10/30/2020, CT 10/28/2020 FINDINGS: Punctate foci of gas within the right mid abdomen are in keeping with the extensive pneumatosis noted on prior CT examination though these changes appear slightly improved in the interval. Moderate gaseous distension of the colon persists suggestive of a a mild colonic ileus. Gastrostomy catheter is seen overlying the expected stomach within the epigastrium. No gross free intraperitoneal gas. No acute bone abnormality. IMPRESSION: Improving pneumatosis involving the right mid abdomen. Persistent mild gaseous distension of the colon suggesting an underlying mild colonic ileus. No free air. Electronically Signed   By: Helyn Numbers MD  On: 11/01/2020 06:17    Assessment/Plan Active Problems:   Acute on chronic respiratory failure with hypoxia (HCC)   COPD, severe (HCC)   Chronic atrial fibrillation (HCC)   Bilateral pneumonia   Metabolic encephalopathy   1. Acute on chronic respiratory failure with hypoxia plan is to continue with pressure support patient will be advanced to 16 hours as tolerated. 2. Severe COPD medical management 3. Chronic atrial fibrillation rate is controlled 4. Bilateral pneumonia treated 5. Encephalopathy no change   I have personally seen and evaluated the patient,  evaluated laboratory and imaging results, formulated the assessment and plan and placed orders. The Patient requires high complexity decision making with multiple systems involvement.  Rounds were done with the Respiratory Therapy Director and Staff therapists and discussed with nursing staff also.  [name], MD [facility] Pulmonary Critical Care Medicine Sleep Medicine

## 2020-11-03 ENCOUNTER — Other Ambulatory Visit (HOSPITAL_COMMUNITY): Payer: Medicare Other

## 2020-11-03 DIAGNOSIS — J690 Pneumonitis due to inhalation of food and vomit: Secondary | ICD-10-CM | POA: Diagnosis not present

## 2020-11-03 DIAGNOSIS — J9621 Acute and chronic respiratory failure with hypoxia: Secondary | ICD-10-CM | POA: Diagnosis not present

## 2020-11-03 DIAGNOSIS — J449 Chronic obstructive pulmonary disease, unspecified: Secondary | ICD-10-CM | POA: Diagnosis not present

## 2020-11-03 DIAGNOSIS — I482 Chronic atrial fibrillation, unspecified: Secondary | ICD-10-CM | POA: Diagnosis not present

## 2020-11-03 LAB — BASIC METABOLIC PANEL
Anion gap: 11 (ref 5–15)
BUN: 7 mg/dL (ref 6–20)
CO2: 25 mmol/L (ref 22–32)
Calcium: 8.4 mg/dL — ABNORMAL LOW (ref 8.9–10.3)
Chloride: 98 mmol/L (ref 98–111)
Creatinine, Ser: 0.44 mg/dL — ABNORMAL LOW (ref 0.61–1.24)
GFR, Estimated: >60 mL/min (ref >60)
Glucose, Bld: 83 mg/dL (ref 70–99)
Potassium: 4.3 mmol/L (ref 3.5–5.1)
Sodium: 134 mmol/L — ABNORMAL LOW (ref 135–145)

## 2020-11-03 LAB — CBC
HCT: 28.3 % — ABNORMAL LOW (ref 39.0–52.0)
Hemoglobin: 9.2 g/dL — ABNORMAL LOW (ref 13.0–17.0)
MCH: 27.1 pg (ref 26.0–34.0)
MCHC: 32.5 g/dL (ref 30.0–36.0)
MCV: 83.2 fL (ref 80.0–100.0)
Platelets: 236 10*3/uL (ref 150–400)
RBC: 3.4 MIL/uL — ABNORMAL LOW (ref 4.22–5.81)
RDW: 18.8 % — ABNORMAL HIGH (ref 11.5–15.5)
WBC: 9.8 10*3/uL (ref 4.0–10.5)
nRBC: 0 % (ref 0.0–0.2)

## 2020-11-03 LAB — MAGNESIUM: Magnesium: 2.1 mg/dL (ref 1.7–2.4)

## 2020-11-03 NOTE — Progress Notes (Signed)
Pulmonary Critical Care Medicine [facility] [place]   PULMONARY CRITICAL CARE SERVICE  PROGRESS NOTE  Date of Service: 11/03/2020  Maurice Brennan  KHT:[id number]  DOB: 1965-11-05   DOA: 10/15/2020  Referring Physician: Carron Curie, MD  HPI: Maurice Brennan is a 55 y.o. male seen for follow up of Acute on Chronic Respiratory Failure.  Patient is comfortable right now without distress   Medications: Reviewed on Rounds  Physical Exam:  Vitals: Temperature is 96.8 pulse 59 respiratory rate 16 blood pressure is 93/51 saturations 100%  Ventilator Settings on the ventilator orally intubated pressure support FiO2 10/5  . General: Comfortable at this time . Eyes: Grossly normal lids, irises & conjunctiva . ENT: grossly tongue is normal . Neck: no obvious mass . Cardiovascular: S1 S2 normal no gallop . Respiratory: Scattered rhonchi coarse breath sounds . Abdomen: soft . Skin: no rash seen on limited exam . Musculoskeletal: not rigid . Psychiatric:unable to assess . Neurologic: no seizure no involuntary movements         Lab Data:   Basic Metabolic Panel: Recent Labs  Lab 10/29/20 0208 10/30/20 0429 10/31/20 0813 11/01/20 0653 11/02/20 1434 11/03/20 0711  NA 129* 129* 132* 134*  --  134*  K 3.8 3.1* 3.2* 2.8* 3.4* 4.3  CL 91* 94* 99 99  --  98  CO2 21* 23 21* 24  --  25  GLUCOSE 119* 95 84 94  --  83  BUN 50* 47* 31* 19  --  7  CREATININE 2.10* 1.30* 0.67 0.53*  --  0.44*  CALCIUM 9.5 8.9 8.2* 8.2*  --  8.4*  MG  --   --  1.6* 1.7 1.4* 2.1    ABG: Recent Labs  Lab 10/28/20 1831 10/28/20 2210 10/29/20 0807  PHART 7.165* 7.309* 7.396  PCO2ART 70.3* 45.2 38.0  PO2ART 70.7* 322* 143*  HCO3 24.4 21.8 22.8  O2SAT 87.1 99.9 99.2    Liver Function Tests: Recent Labs  Lab 10/27/20 1230 10/30/20 0429  AST 16 18  ALT 20 14  ALKPHOS 88 67  BILITOT 1.5* 1.3*  PROT 7.8 6.4*  ALBUMIN 3.2* 3.0*   No results for input(s): LIPASE, AMYLASE in the last 168  hours. Recent Labs  Lab 10/27/20 1230  AMMONIA 36*    CBC: Recent Labs  Lab 10/28/20 1926 10/29/20 0208 10/30/20 0429 10/31/20 0351 11/01/20 0653  WBC 25.8* 18.4* 12.3* 10.6* 10.1  HGB 13.1 10.6* 9.5* 9.5* 9.3*  HCT 43.1 32.1* 28.4* 29.0* 29.0*  MCV 86.9 83.2 81.6 83.3 84.1  PLT 404* 297 285 249 229    Cardiac Enzymes: No results for input(s): CKTOTAL, CKMB, CKMBINDEX, TROPONINI in the last 168 hours.  BNP (last 3 results) No results for input(s): BNP in the last 8760 hours.  ProBNP (last 3 results) No results for input(s): PROBNP in the last 8760 hours.  Radiological Exams: DG Abd 1 View  Result Date: 11/03/2020 CLINICAL DATA:  Abdominal distention. EXAM: ABDOMEN - 1 VIEW COMPARISON:  11/01/2020.  CT 10/28/2020 FINDINGS: Gastrostomy tube noted stable position of the left upper quadrant. Improved bowel distention in pneumatosis noted. Barium again noted in colonic diverticuli no free air identified. Aortoiliac and visceral atherosclerotic vascular calcification. Stable elevation left hemidiaphragm. Degenerative change thoracolumbar spine. Prior thoracic spine fusion. IMPRESSION: Gastrostomy tube noted in stable position. Improved bowel distention and pneumatosis. No free air identified. Electronically Signed   By: Maisie Fus  Register   On: 11/03/2020 06:35    Assessment/Plan Active Problems:  Acute on chronic respiratory failure with hypoxia (HCC)   COPD, severe (HCC)   Chronic atrial fibrillation (HCC)   Bilateral pneumonia   Metabolic encephalopathy   1. Acute on chronic respiratory failure hypoxia patient ready for extubation will proceed to extubation 2. Severe COPD improved 3. Chronic atrial fibrillation rate is controlled 4. Bilateral pneumonia treated we will continue to monitor 5. Metabolic encephalopathy no change   I have personally seen and evaluated the patient, evaluated laboratory and imaging results, formulated the assessment and plan and placed  orders. The Patient requires high complexity decision making with multiple systems involvement.  Rounds were done with the Respiratory Therapy Director and Staff therapists and discussed with nursing staff also.  [name], MD [facility] Pulmonary Critical Care Medicine Sleep Medicine

## 2020-11-04 ENCOUNTER — Other Ambulatory Visit (HOSPITAL_COMMUNITY): Payer: Medicare Other

## 2020-11-04 MED ORDER — IOHEXOL 9 MG/ML PO SOLN
ORAL | Status: AC
  Filled 2020-11-04: qty 1000

## 2020-11-04 MED ORDER — IOHEXOL 300 MG/ML  SOLN
100.0000 mL | Freq: Once | INTRAMUSCULAR | Status: AC | PRN
  Administered 2020-11-04: 100 mL via INTRAVENOUS

## 2020-11-04 NOTE — Progress Notes (Signed)
PROGRESS NOTE    Maurice Brennan  PIR:[id number] DOB: 23-Feb-1966 DOA: 10/15/2020  Brief Narrative:  Maurice Brennan is an 55 y.o. male who presented to [facility] healthcare on 08/28/2020 with dyspnea, nausea, vomiting.  When he presented to the acute facility his O2 sat was in the upper 60s.  He apparently also had right-sided abdominal pain.  In the ED he was noted to be tachycardic.  He was started on Cardizem infusion.  He was placed on BiPAP.  However, continued to have worsening respiratory distress.  CT imaging of the chest abdomen and pelvis showed moderate right pleural effusion, trace left pleural effusion, small pericardial effusion, enlarged heart, airspace opacities in the right lower lobe, peribronchovascular groundglass opacities in the bilateral upper lobe with bronchial wall thickening.  He was also noted to have hepatic steatosis with fatty atrophy of the pancreas and ascites.  He was started on meropenem for empiric SBP coverage.  He was treated with Lasix, Aldactone.  His hospital course was complicated, he also had acute renal failure.  His respiratory status worsened and he required intubation on 09/02/2020.  Transthoracic echocardiogram showed stage III diastolic dysfunction, moderate to severe MR, pulmonary hypertension.  On 09/05/2020 patient apparently self extubated.  He was then placed on BiPAP.  Unfortunately worsened and had to be reintubated, required vasopressors.  He also had worsening leukocytosis.  His cultures were negative but his Fungitell on 09/14/2020 was positive.  His antimicrobials were broadened to vancomycin, meropenem, micafungin which he completed.  Eventually had tracheostomy and PEG placement due to failure to wean.  He had atrial fibrillation and was started on amiodarone.  Due to his complex medical problems he is transferred and admitted to [facility] on 10/15/2020.  After admission here he had tracheal aspirate cultures that showed Pseudomonas aeruginosa.  He was  treated with vancomycin, meropenem.  He had rapid response on 10/28/2020 and had to be intubated.  Subsequently had worsening leukocytosis therefore was started on ciprofloxacin.  Flagyl was added for anaerobic coverage.  He also had worsening abdominal distention. CT of the abdomen and pelvis with contrast on 10/28/2020 per report distended ascending and proximal transverse colon with diffuse pneumatosis, concerning for diffuse ileus.  Given the findings recommended to start him on IV meropenem.  Assessment & Plan:  Active Problems:   Acute on chronic respiratory failure with hypoxia, ventilator dependent Sepsis Leukocytosis Pneumatosis of the ascending and proximal transverse colon with abdominal distention Pneumonia with Pseudomonas Diffuse ileus  COPD, severe   Chronic atrial fibrillation    Protein calorie malnutrition Acute kidney injury Alcoholic cirrhosis  Acute on chronic respiratory failure with hypoxemia: His previous respiratory culture showed Pseudomonas for which he was treated with ciprofloxacin. He also received treatment with Flagyl for anaerobic coverage. However, after that he started having other issues including sepsis, pneumatosis with abdominal distention/diffuse ileus.  Therefore suggested to discontinue the ciprofloxacin and start him on meropenem.  Pulmonary following.  He is currently on oxygen by nasal cannula.  Tentative end date for the meropenem is 11/06/2020 pending improvement.  His respiratory status is stable at this time.  However, he is at risk for aspiration and aspiration pneumonia.  Sepsis: In the setting of abdominal distention with concern for ileus/pneumatosis of the ascending and proximal transverse colon.  Recommended changing to IV meropenem.  His CT was done on 10/28/2020.  He currently is n.p.o. Continue antibiotics.  Plan to treat for duration of 7-10 days pending improvement.  KUB showing some improvement.  For his pneumatosis and diffuse ileus suggest to  repeat CT of the abdomen and pelvis to evaluate.  Please monitor CBC. Please monitor BUN/cr closely while on antibiotics.  Pneumonia: His previous respiratory culture showed Pseudomonas.  He was already treated for that.  Recent CT of the abdomen and pelvis done on 10/28/2020 still showing patchy bibasilar consolidation. Currently on antibiotics again for the above-mentioned reason.  Continue to monitor closely. He is high risk for aspiration and aspiration pneumonia. If his respiratory status worsens suggest chest imaging preferably chest CT to better evaluate and send for repeat respiratory cultures.  COPD: Continue medication and management per the primary team.  Atrial fibrillation: Continue medications and management per the primary team.  Protein calorie malnutrition: Management per the primary team.  Acute kidney injury: In the setting of sepsis.  IV fluids per the primary team.  Please monitor BUN/trending closely especially while on antibiotics.  Further management per primary team.  Alcoholic cirrhosis: He is high risk for ascites from the cirrhosis.  He is also high risk for hyperammonemia and hepatic encephalopathy.  Per primary team on lactulose.  Unfortunately due to his complex medical problems he is very high risk for worsening and decompensation.  Plan of care discussed with the primary team and pharmacy.   Subjective: His abdominal distention is better.  He is asking for something to drink.  He is currently n.p.o. due to ileus.  Objective: Vitals: Temperature 98, pulse 63, respiratory rate 19, blood pressure 103/51, pulse oximetry 100%  Examination: Constitutional: Ill-appearing male, awake, on oxygen nasal cannula Head: Atraumatic, normocephalic Eyes: PERLA, EOMI, irises appear normal ENMT: external ears and nose appear normal, normal hearing Neck: Supple CVS: S1-S2 Respiratory:  Decreased breath sounds lower lobes, rhonchi, no wheezing.  Abdomen:  Soft,  distention improving, hypoactive bowel sounds, PEG tube in place Musculoskeletal: No edema, left toe discoloration Neuro: Awake, debility with generalized weakness Psych: stable mood and affect, mental status Skin: no rashes     Data Reviewed: I have personally reviewed following labs and imaging studies  CBC: Recent Labs  Lab 10/29/20 0208 10/30/20 0429 10/31/20 0351 11/01/20 0653 11/03/20 0711  WBC 18.4* 12.3* 10.6* 10.1 9.8  HGB 10.6* 9.5* 9.5* 9.3* 9.2*  HCT 32.1* 28.4* 29.0* 29.0* 28.3*  MCV 83.2 81.6 83.3 84.1 83.2  PLT 297 285 249 229 236    Basic Metabolic Panel: Recent Labs  Lab 10/29/20 0208 10/30/20 0429 10/31/20 0813 11/01/20 0653 11/02/20 1434 11/03/20 0711  NA 129* 129* 132* 134*  --  134*  K 3.8 3.1* 3.2* 2.8* 3.4* 4.3  CL 91* 94* 99 99  --  98  CO2 21* 23 21* 24  --  25  GLUCOSE 119* 95 84 94  --  83  BUN 50* 47* 31* 19  --  7  CREATININE 2.10* 1.30* 0.67 0.53*  --  0.44*  CALCIUM 9.5 8.9 8.2* 8.2*  --  8.4*  MG  --   --  1.6* 1.7 1.4* 2.1    GFR: CrCl cannot be calculated (Unknown ideal weight.).  Liver Function Tests: Recent Labs  Lab 10/30/20 0429  AST 18  ALT 14  ALKPHOS 67  BILITOT 1.3*  PROT 6.4*  ALBUMIN 3.0*    CBG: No results for input(s): GLUCAP in the last 168 hours.   No results found for this or any previous visit (from the past 240 hour(s)).    Radiology Studies: DG Abd 1 View  Result Date: 11/03/2020 CLINICAL DATA:  Abdominal distention. EXAM: ABDOMEN - 1 VIEW COMPARISON:  11/01/2020.  CT 10/28/2020 FINDINGS: Gastrostomy tube noted stable position of the left upper quadrant. Improved bowel distention in pneumatosis noted. Barium again noted in colonic diverticuli no free air identified. Aortoiliac and visceral atherosclerotic vascular calcification. Stable elevation left hemidiaphragm. Degenerative change thoracolumbar spine. Prior thoracic spine fusion. IMPRESSION: Gastrostomy tube noted in stable position. Improved  bowel distention and pneumatosis. No free air identified. Electronically Signed   By: Maisie Fus  Register   On: 11/03/2020 06:35   Scheduled Meds: Please see MAR  Vonzella Nipple, MD  11/04/2020, 4:06 PM

## 2020-11-07 ENCOUNTER — Other Ambulatory Visit (HOSPITAL_COMMUNITY): Payer: Medicare Other

## 2020-11-07 LAB — CBC
HCT: 33.2 % — ABNORMAL LOW (ref 39.0–52.0)
Hemoglobin: 10.2 g/dL — ABNORMAL LOW (ref 13.0–17.0)
MCH: 26.3 pg (ref 26.0–34.0)
MCHC: 30.7 g/dL (ref 30.0–36.0)
MCV: 85.6 fL (ref 80.0–100.0)
Platelets: 260 10*3/uL (ref 150–400)
RBC: 3.88 MIL/uL — ABNORMAL LOW (ref 4.22–5.81)
RDW: 18.8 % — ABNORMAL HIGH (ref 11.5–15.5)
WBC: 9 10*3/uL (ref 4.0–10.5)
nRBC: 0 % (ref 0.0–0.2)

## 2020-11-07 LAB — BASIC METABOLIC PANEL
Anion gap: 6 (ref 5–15)
BUN: <5 mg/dL — ABNORMAL LOW (ref 6–20)
CO2: 29 mmol/L (ref 22–32)
Calcium: 8.7 mg/dL — ABNORMAL LOW (ref 8.9–10.3)
Chloride: 100 mmol/L (ref 98–111)
Creatinine, Ser: 0.44 mg/dL — ABNORMAL LOW (ref 0.61–1.24)
GFR, Estimated: >60 mL/min (ref >60)
Glucose, Bld: 78 mg/dL (ref 70–99)
Potassium: 4 mmol/L (ref 3.5–5.1)
Sodium: 135 mmol/L (ref 135–145)

## 2020-11-07 LAB — MAGNESIUM: Magnesium: 1.2 mg/dL — ABNORMAL LOW (ref 1.7–2.4)

## 2020-11-08 LAB — MAGNESIUM: Magnesium: 1.1 mg/dL — ABNORMAL LOW (ref 1.7–2.4)

## 2020-11-09 LAB — SARS CORONAVIRUS 2 (TAT 6-24 HRS): SARS Coronavirus 2: NEGATIVE

## 2020-11-10 LAB — MAGNESIUM: Magnesium: 1.9 mg/dL (ref 1.7–2.4)

## 2022-05-29 IMAGING — DX DG ABDOMEN 1V
1 series · 1 of 1 positions shown · non-contrast
Comparison: None.

CLINICAL DATA: Gastrostomy tube

EXAM:
ABDOMEN - 1 VIEW

[abdomen]
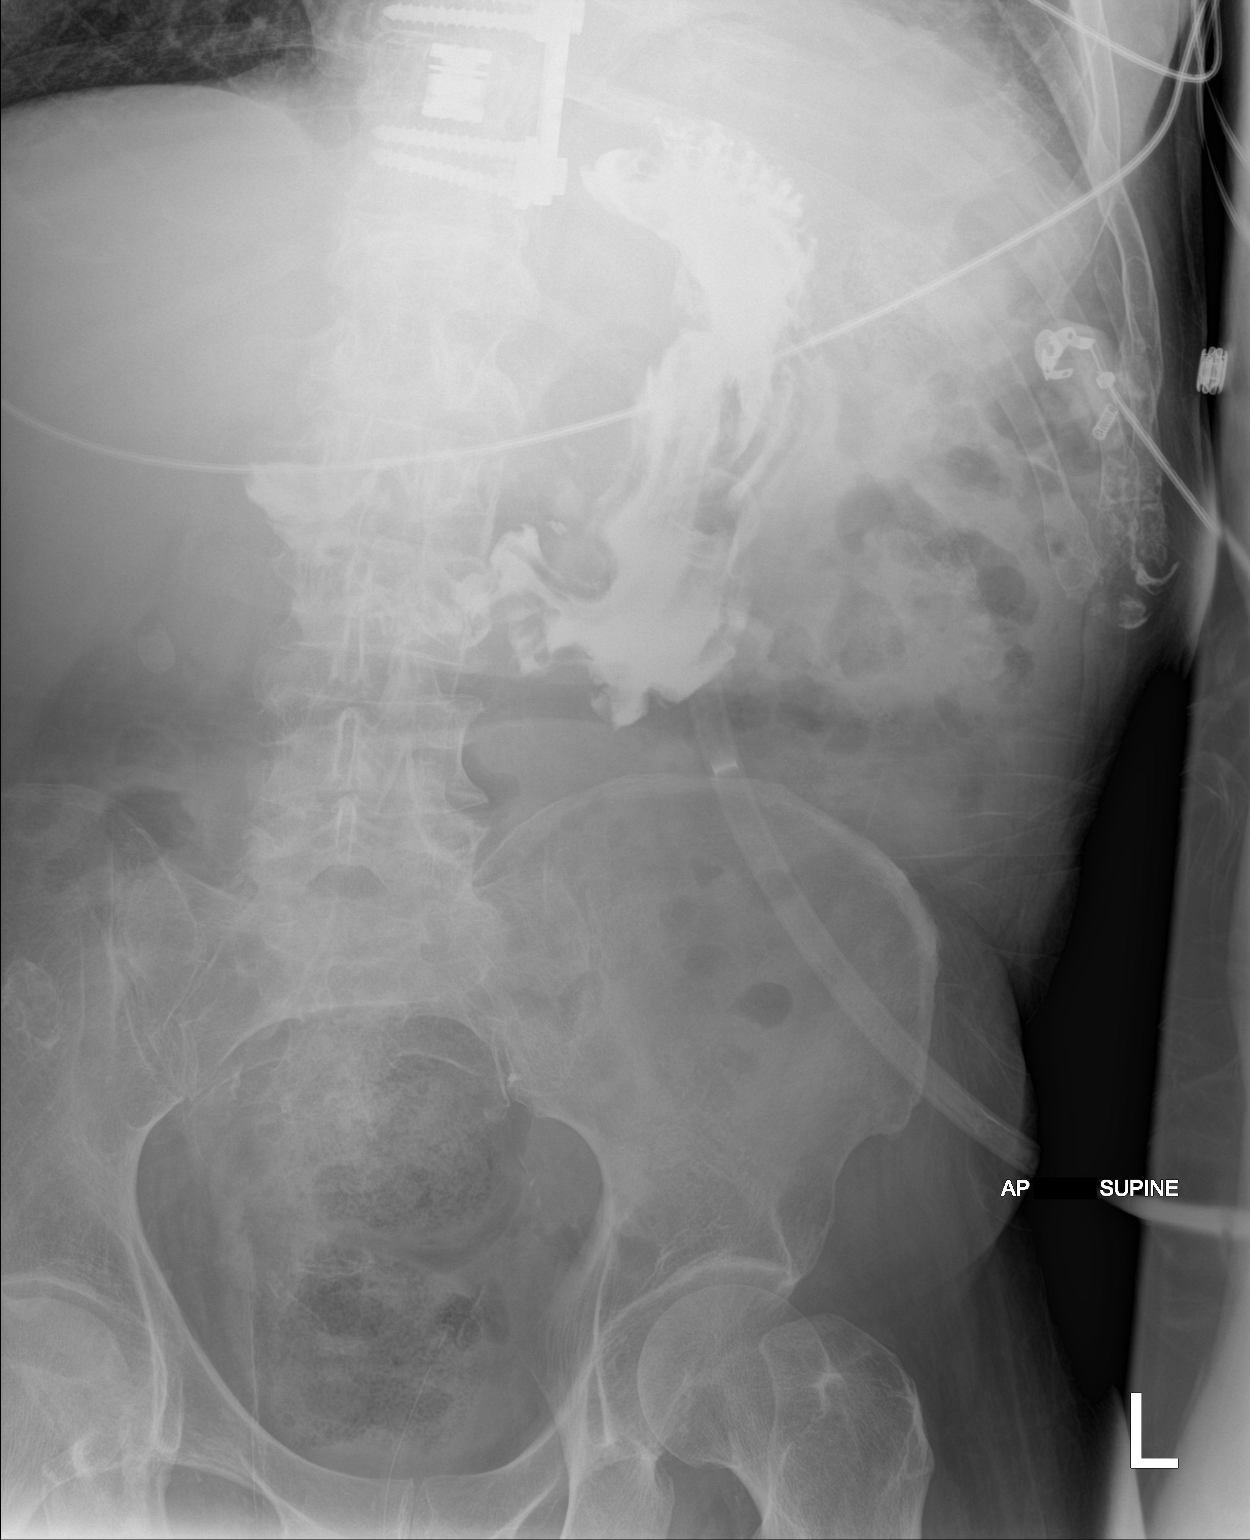

[1 of 1 positions shown; findings below may reference images not displayed]

FINDINGS: The bowel gas pattern is normal. Gastrostomy Miyata projects over the
body of the stomach. Contrast opacified the stomach and duodenal
bulb. No gross extravasation. Round calcification in the right mid
abdomen may represent gallstone or kidney stone.
IMPRESSION: Gastrostomy tube appears to be within the stomach. No gross
extravasation. Nonobstructive bowel gas pattern.

## 2022-06-09 IMAGING — US US ABDOMEN LIMITED RUQ/ASCITES
1 series · 14 of 25 positions shown · non-contrast
Comparison: None.

CLINICAL DATA: Cholecystitis

EXAM:
ULTRASOUND ABDOMEN LIMITED RIGHT UPPER QUADRANT

[Series 1: us abdomen limited ruq (liver/gb) · 14 of 33 slices shown]
[im 1/33]
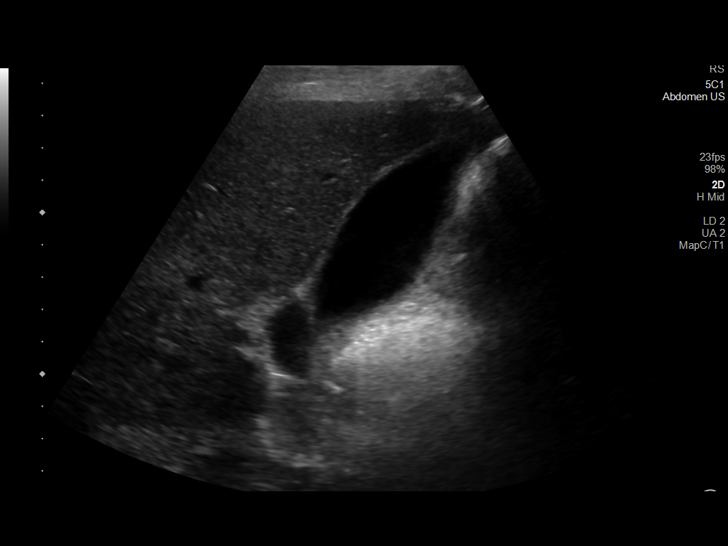
[im 3/33]
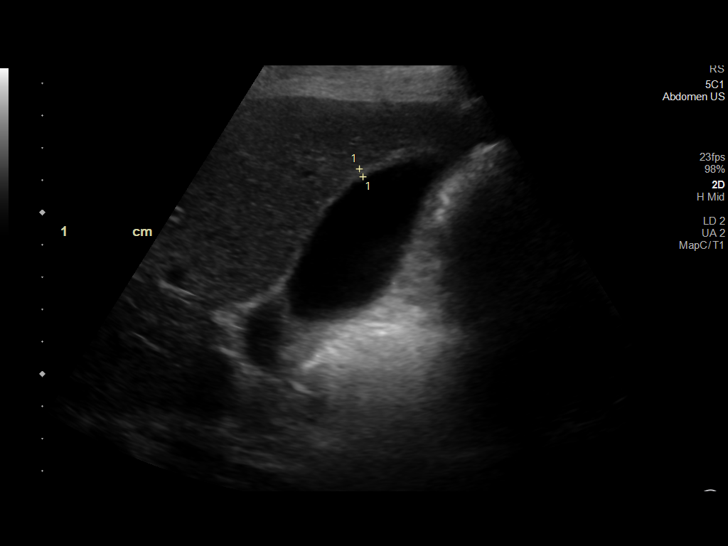
[im 6/33]
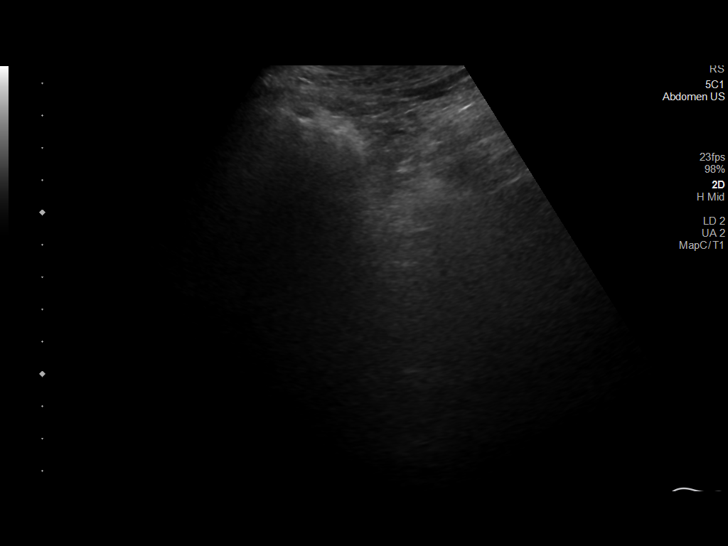
[im 9/33]
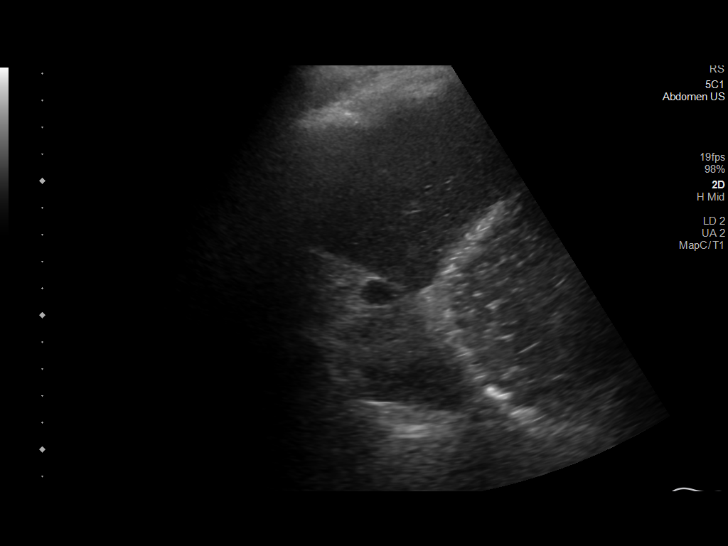
[im 11/33]
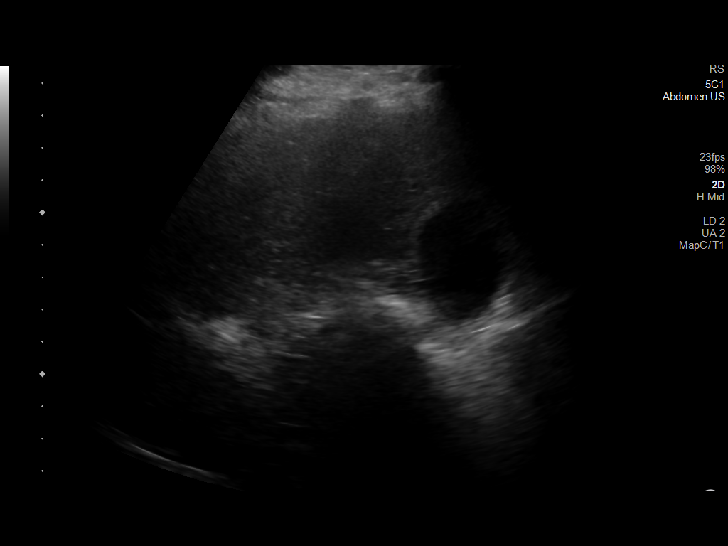
[im 13/33]
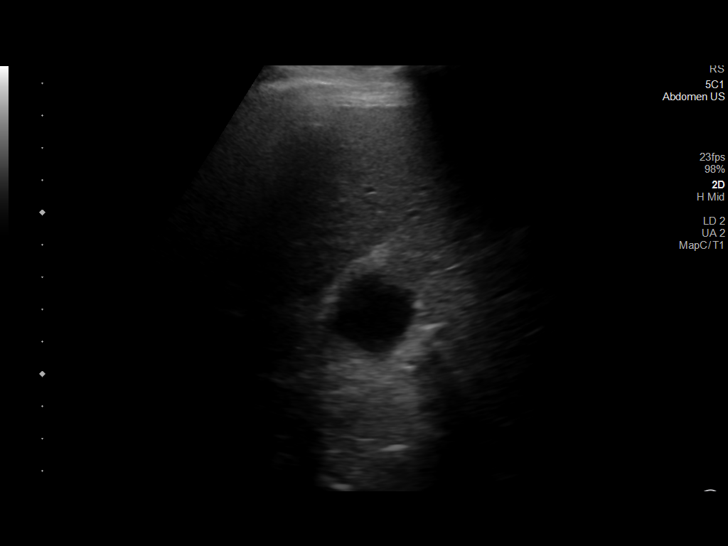
[im 15/33]
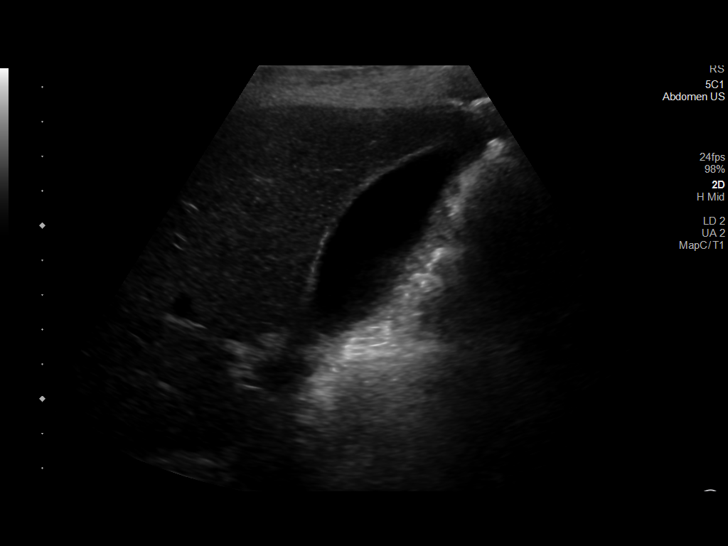
[im 18/33]
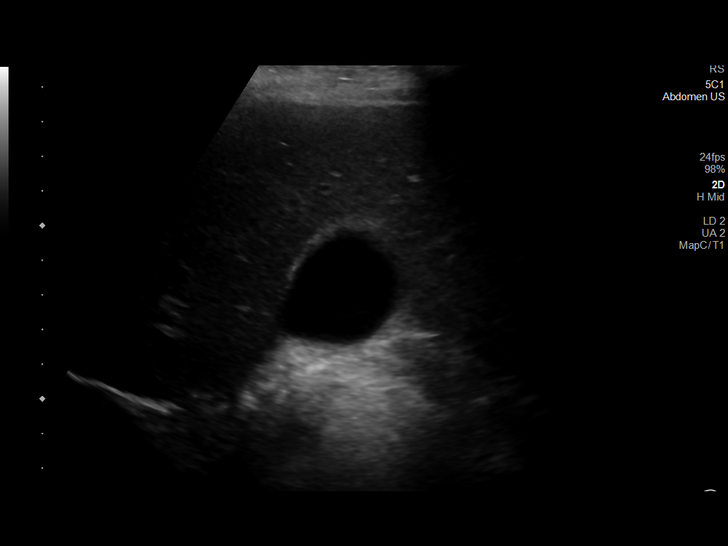
[im 21/33]
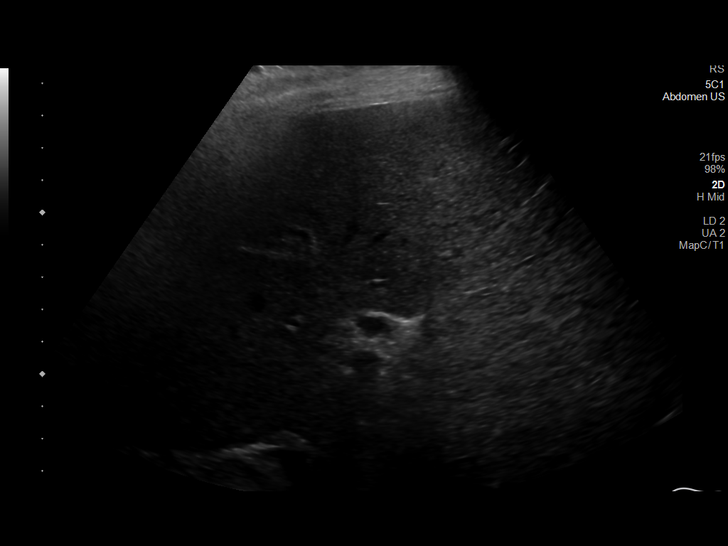
[im 22/33]
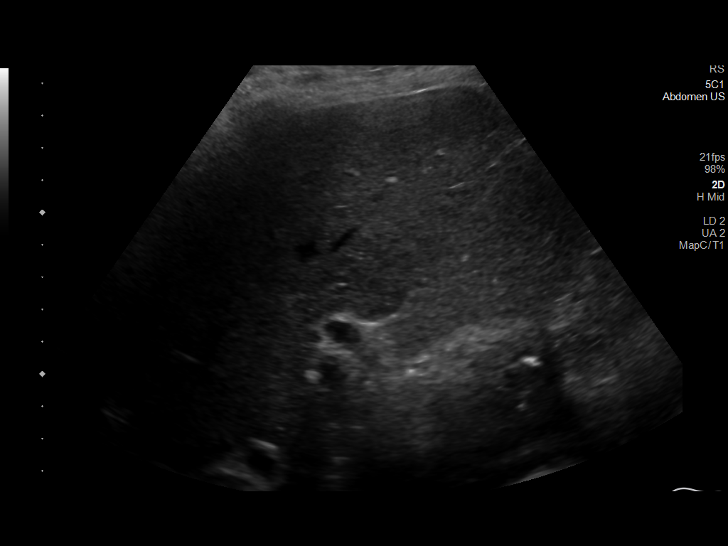
[im 25/33]
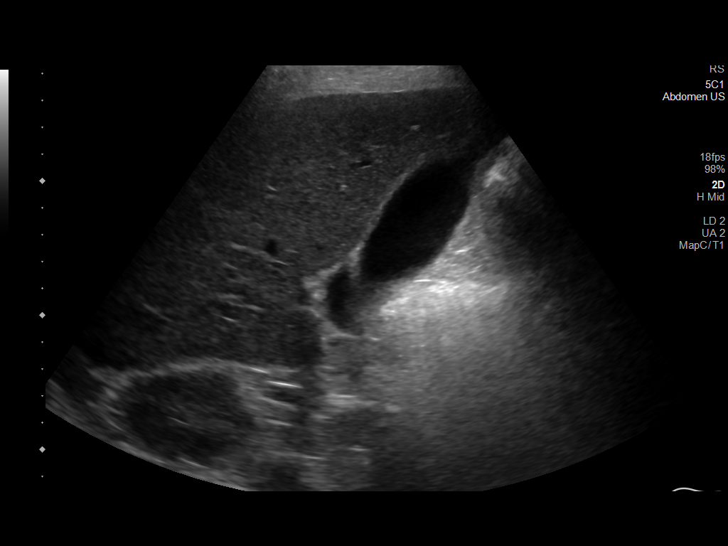
[im 27/33]
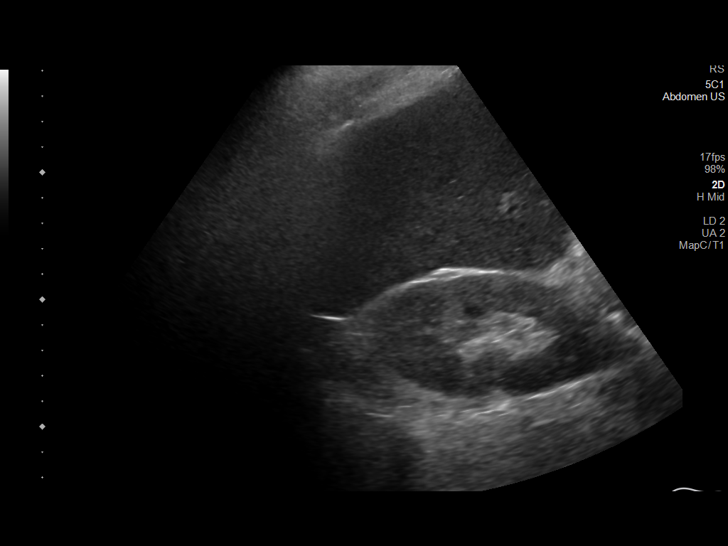
[im 30/33]
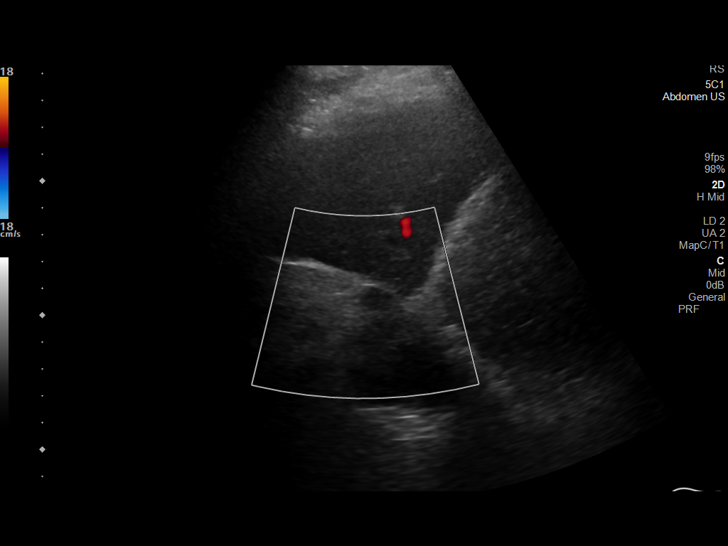
[im 33/33]
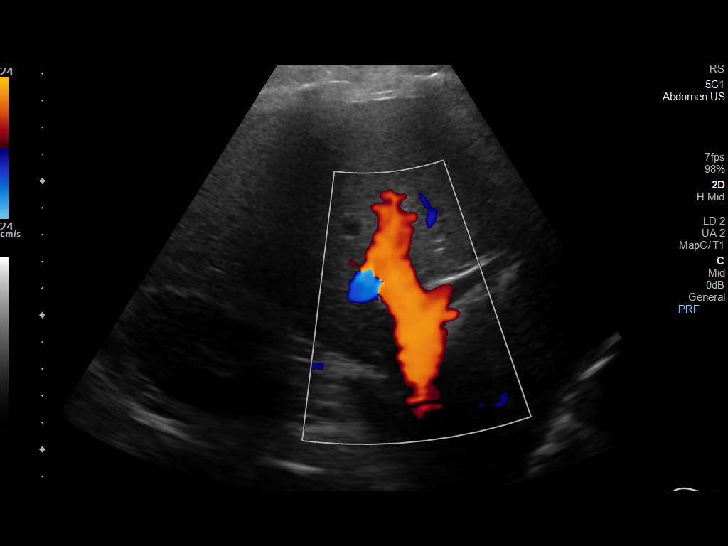

[14 of 25 positions shown; findings below may reference images not displayed]

FINDINGS: Gallbladder:

No gallstones or wall thickening visualized. No sonographic Murphy
sign noted by sonographer.

Common bile duct:

Diameter: 5.7 mm

Liver:

No focal lesion identified, however limited visualization of the
left lobe the liver. There is increased echogenicity seen throughout
the liver parenchyma. Portal vein is patent on color Doppler imaging
with normal direction of blood flow towards the liver.

Other: Small hypoechoic lesion seen within the upper pole of the
right kidney measuring 1.1 x 1.0 x 1.2 cm
IMPRESSION: Normal appearing gallbladder.

Hepatic steatosis

## 2022-06-17 IMAGING — DX DG ABDOMEN 1V
2 series · 2 of 2 positions shown · non-contrast
Comparison: 11/01/2020.  CT 10/28/2020

CLINICAL DATA: Abdominal distention.

EXAM:
ABDOMEN - 1 VIEW

[abdomen kub (1 of 2)]
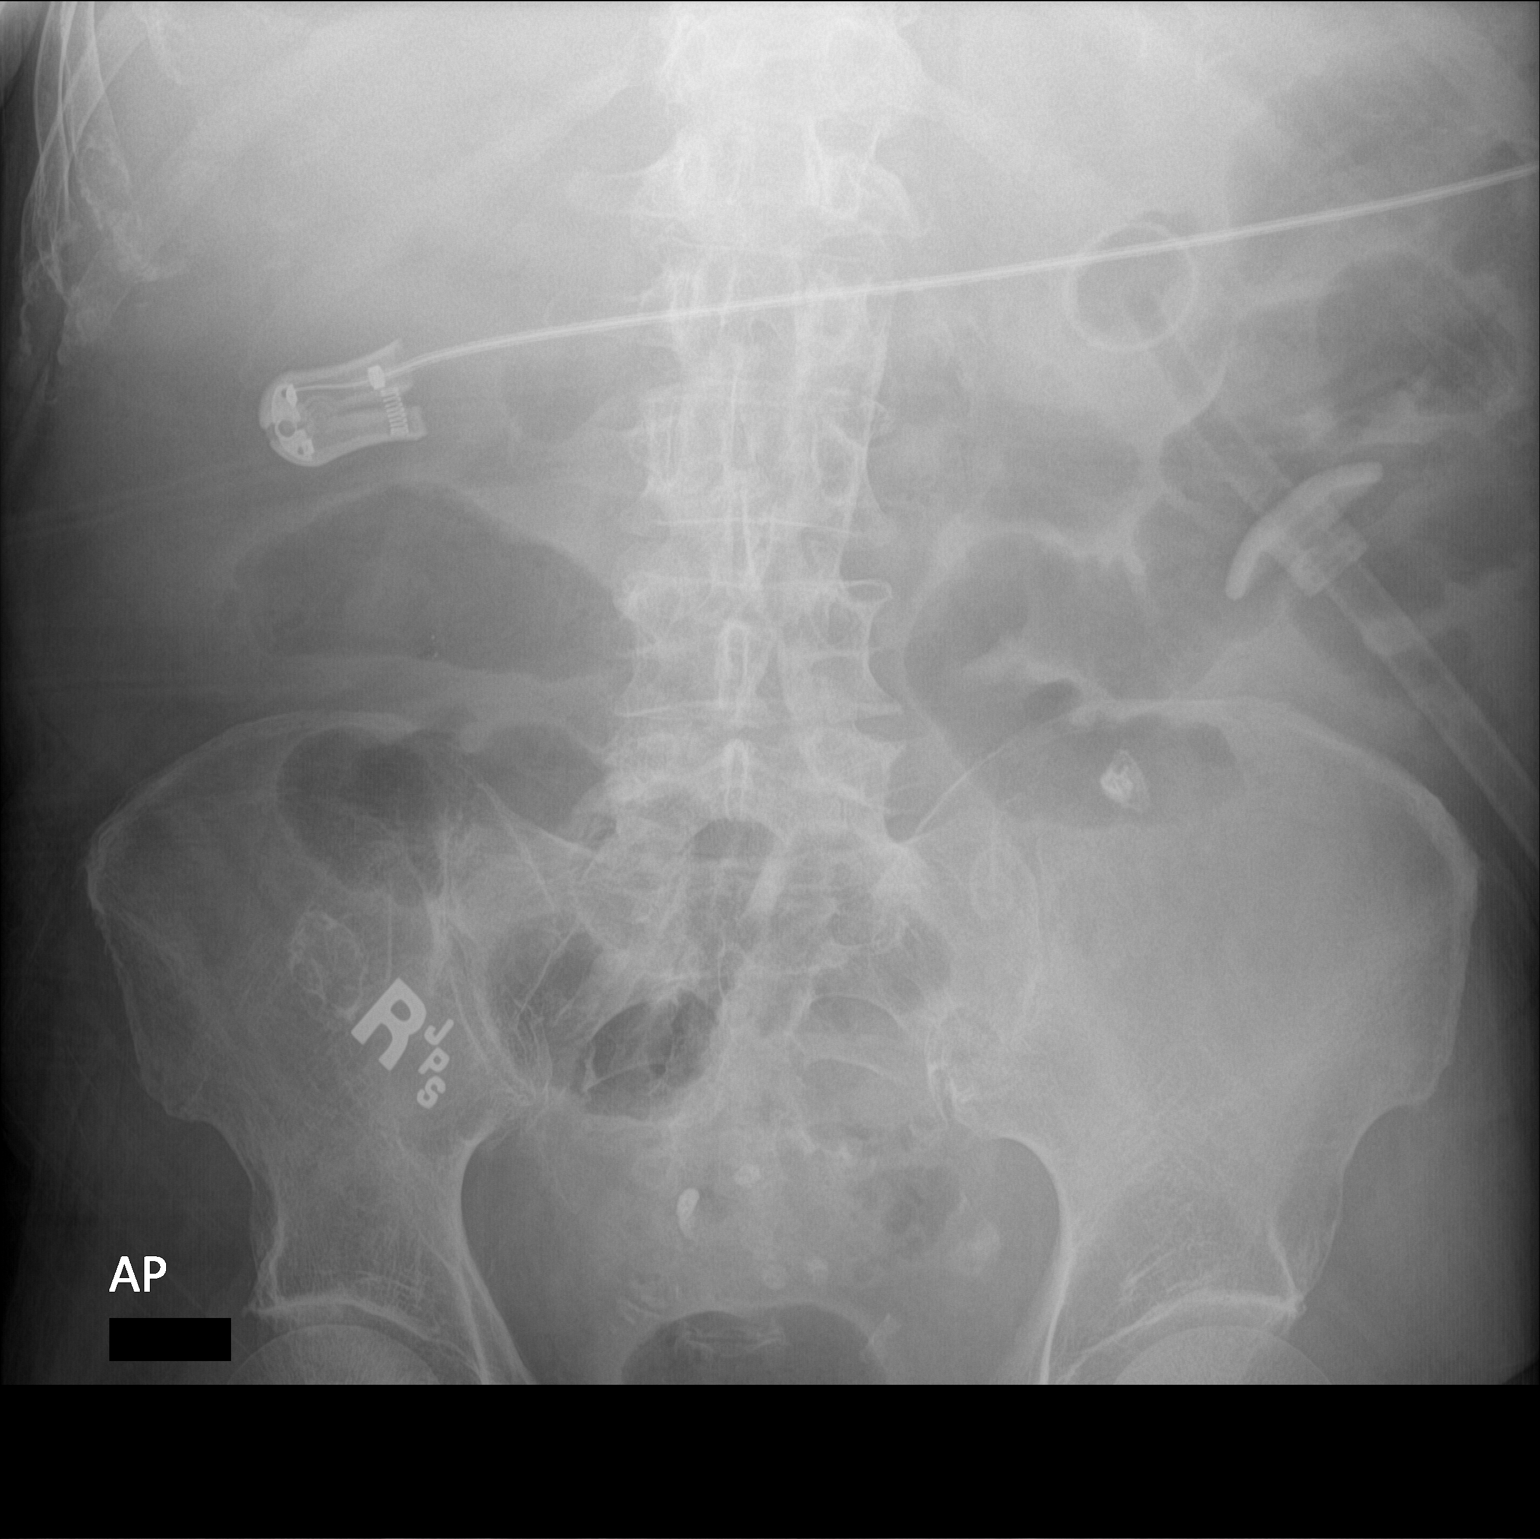

[abdomen kub (2 of 2)]
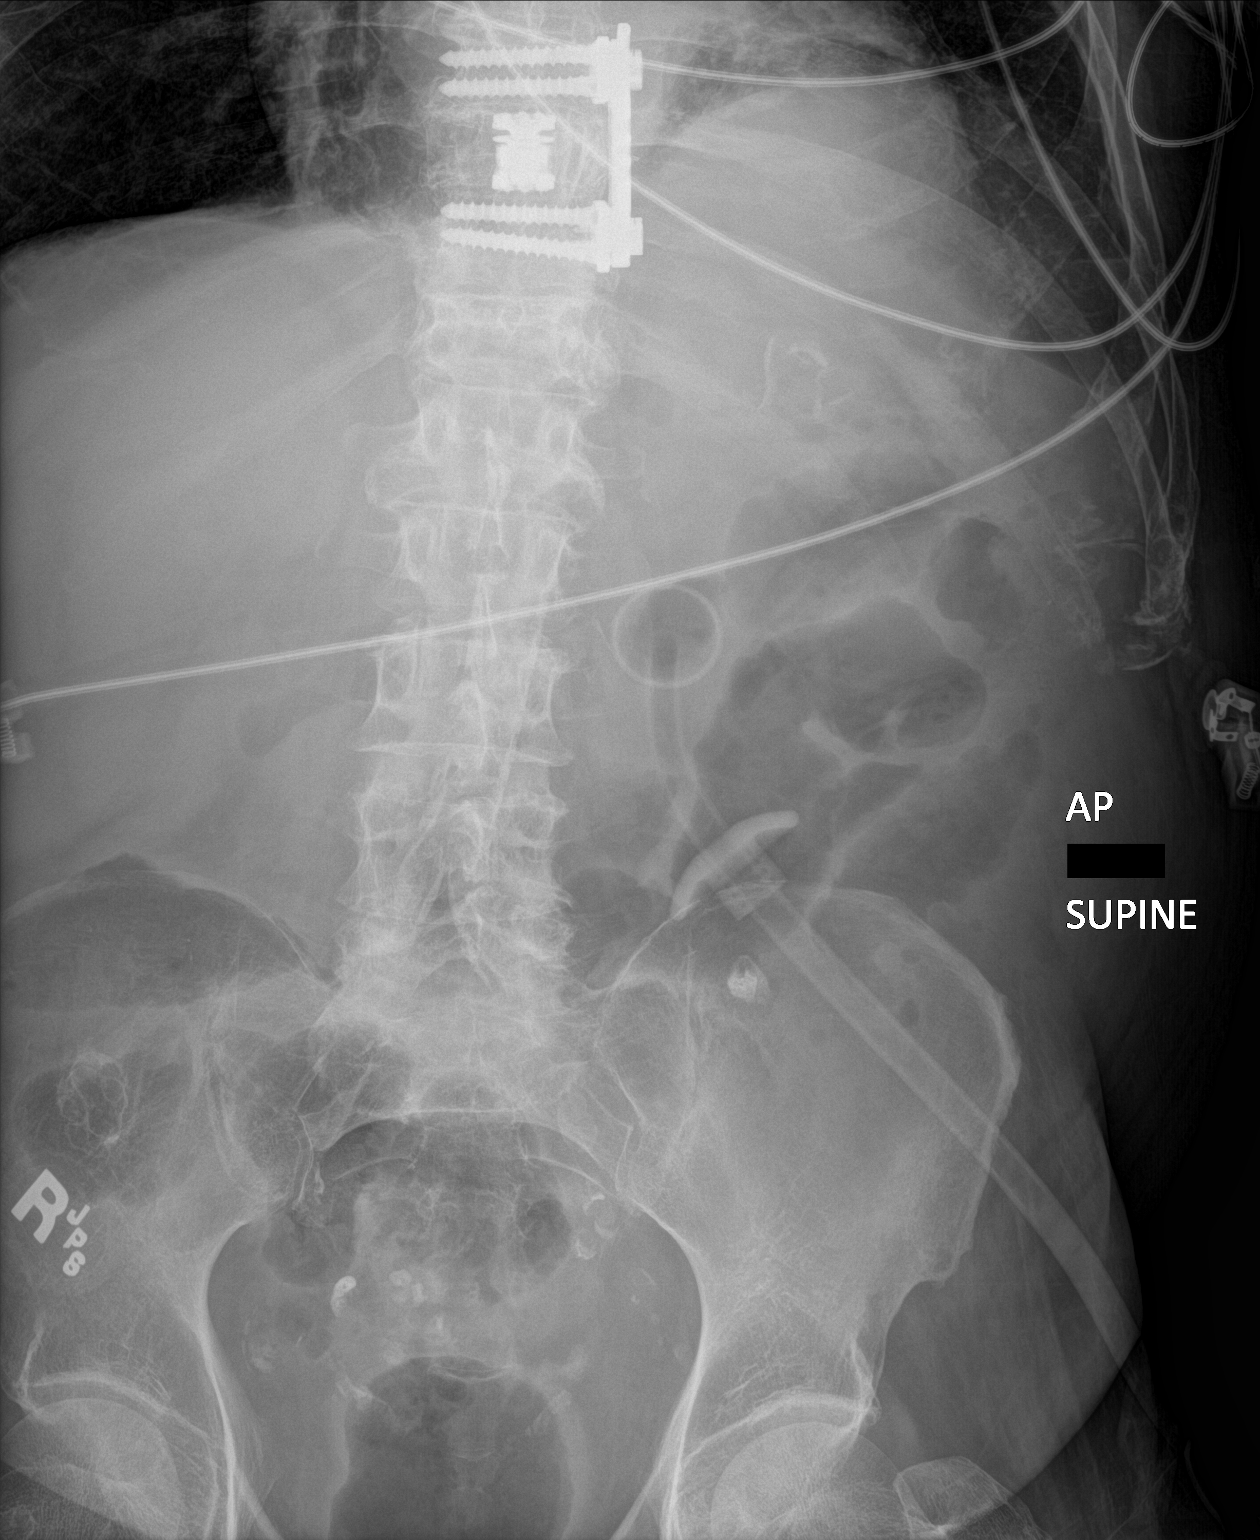

[2 of 2 positions shown; findings below may reference images not displayed]

FINDINGS: Gastrostomy tube noted stable position of the left upper quadrant.
Improved bowel distention in pneumatosis noted. Barium again noted
in colonic diverticuli no free air identified. Aortoiliac and
visceral atherosclerotic vascular calcification. Stable elevation
left hemidiaphragm. Degenerative change thoracolumbar spine. Prior
thoracic spine fusion.
IMPRESSION: Gastrostomy tube noted in stable position. Improved bowel distention
and pneumatosis. No free air identified.

## 2023-09-29 DEATH — deceased
